# Patient Record
Sex: Female | Born: 1955 | Hispanic: No | Marital: Single | Smoking: Former smoker
Health system: Southern US, Community
[De-identification: ages and names within clinical notes are randomized; demographics above are authoritative.]

## PROBLEM LIST (undated history)

## (undated) DIAGNOSIS — I619 Nontraumatic intracerebral hemorrhage, unspecified: Secondary | ICD-10-CM

## (undated) DIAGNOSIS — I69391 Dysphagia following cerebral infarction: Secondary | ICD-10-CM

## (undated) DIAGNOSIS — J9610 Chronic respiratory failure, unspecified whether with hypoxia or hypercapnia: Secondary | ICD-10-CM

## (undated) DIAGNOSIS — I10 Essential (primary) hypertension: Secondary | ICD-10-CM

## (undated) DIAGNOSIS — R403 Persistent vegetative state: Secondary | ICD-10-CM

## (undated) DIAGNOSIS — K5909 Other constipation: Secondary | ICD-10-CM

## (undated) DIAGNOSIS — Z930 Tracheostomy status: Secondary | ICD-10-CM

## (undated) DIAGNOSIS — H16319 Corneal abscess, unspecified eye: Secondary | ICD-10-CM

## (undated) DIAGNOSIS — D649 Anemia, unspecified: Principal | ICD-10-CM

## (undated) DIAGNOSIS — Z720 Tobacco use: Secondary | ICD-10-CM

## (undated) DIAGNOSIS — E119 Type 2 diabetes mellitus without complications: Secondary | ICD-10-CM

## (undated) DIAGNOSIS — K219 Gastro-esophageal reflux disease without esophagitis: Secondary | ICD-10-CM

## (undated) DIAGNOSIS — N182 Chronic kidney disease, stage 2 (mild): Secondary | ICD-10-CM

## (undated) HISTORY — PX: TRACHEOSTOMY: SUR1362

## (undated) HISTORY — PX: PEG TUBE PLACEMENT: SUR1034

---

## 2013-12-19 ENCOUNTER — Inpatient Hospital Stay (HOSPITAL_COMMUNITY): Payer: Medicare Other

## 2013-12-19 ENCOUNTER — Encounter (HOSPITAL_COMMUNITY): Payer: Self-pay | Admitting: Acute Care

## 2013-12-19 ENCOUNTER — Inpatient Hospital Stay (HOSPITAL_COMMUNITY)
Admission: EM | Admit: 2013-12-19 | Discharge: 2013-12-21 | DRG: 811 | Disposition: A | Discharge status: Skilled Nursing Facility | Payer: Medicare Other | Source: Other Acute Inpatient Hospital | Attending: Pulmonary Disease | Admitting: Pulmonary Disease

## 2013-12-19 DIAGNOSIS — E119 Type 2 diabetes mellitus without complications: Secondary | ICD-10-CM | POA: Diagnosis present

## 2013-12-19 DIAGNOSIS — I69991 Dysphagia following unspecified cerebrovascular disease: Secondary | ICD-10-CM | POA: Diagnosis not present

## 2013-12-19 DIAGNOSIS — N182 Chronic kidney disease, stage 2 (mild): Secondary | ICD-10-CM | POA: Diagnosis present

## 2013-12-19 DIAGNOSIS — L89109 Pressure ulcer of unspecified part of back, unspecified stage: Secondary | ICD-10-CM | POA: Diagnosis present

## 2013-12-19 DIAGNOSIS — Z931 Gastrostomy status: Secondary | ICD-10-CM

## 2013-12-19 DIAGNOSIS — R403 Persistent vegetative state: Secondary | ICD-10-CM | POA: Diagnosis present

## 2013-12-19 DIAGNOSIS — I129 Hypertensive chronic kidney disease with stage 1 through stage 4 chronic kidney disease, or unspecified chronic kidney disease: Secondary | ICD-10-CM | POA: Diagnosis present

## 2013-12-19 DIAGNOSIS — Z930 Tracheostomy status: Secondary | ICD-10-CM

## 2013-12-19 DIAGNOSIS — E1169 Type 2 diabetes mellitus with other specified complication: Secondary | ICD-10-CM | POA: Diagnosis not present

## 2013-12-19 DIAGNOSIS — R569 Unspecified convulsions: Secondary | ICD-10-CM | POA: Diagnosis present

## 2013-12-19 DIAGNOSIS — R0902 Hypoxemia: Secondary | ICD-10-CM | POA: Diagnosis present

## 2013-12-19 DIAGNOSIS — D649 Anemia, unspecified: Principal | ICD-10-CM | POA: Diagnosis present

## 2013-12-19 DIAGNOSIS — D689 Coagulation defect, unspecified: Secondary | ICD-10-CM | POA: Diagnosis present

## 2013-12-19 DIAGNOSIS — J189 Pneumonia, unspecified organism: Secondary | ICD-10-CM | POA: Diagnosis present

## 2013-12-19 DIAGNOSIS — N179 Acute kidney failure, unspecified: Secondary | ICD-10-CM | POA: Diagnosis present

## 2013-12-19 DIAGNOSIS — Z9911 Dependence on respirator [ventilator] status: Secondary | ICD-10-CM | POA: Diagnosis not present

## 2013-12-19 DIAGNOSIS — I10 Essential (primary) hypertension: Secondary | ICD-10-CM | POA: Diagnosis present

## 2013-12-19 DIAGNOSIS — J9610 Chronic respiratory failure, unspecified whether with hypoxia or hypercapnia: Secondary | ICD-10-CM | POA: Diagnosis present

## 2013-12-19 DIAGNOSIS — I69391 Dysphagia following cerebral infarction: Secondary | ICD-10-CM

## 2013-12-19 DIAGNOSIS — K5900 Constipation, unspecified: Secondary | ICD-10-CM | POA: Diagnosis present

## 2013-12-19 DIAGNOSIS — L8992 Pressure ulcer of unspecified site, stage 2: Secondary | ICD-10-CM | POA: Diagnosis present

## 2013-12-19 DIAGNOSIS — K219 Gastro-esophageal reflux disease without esophagitis: Secondary | ICD-10-CM | POA: Diagnosis present

## 2013-12-19 DIAGNOSIS — K117 Disturbances of salivary secretion: Secondary | ICD-10-CM

## 2013-12-19 DIAGNOSIS — R7989 Other specified abnormal findings of blood chemistry: Secondary | ICD-10-CM

## 2013-12-19 DIAGNOSIS — Z515 Encounter for palliative care: Secondary | ICD-10-CM

## 2013-12-19 DIAGNOSIS — Z79899 Other long term (current) drug therapy: Secondary | ICD-10-CM

## 2013-12-19 HISTORY — DX: Anemia, unspecified: D64.9

## 2013-12-19 HISTORY — DX: Gastro-esophageal reflux disease without esophagitis: K21.9

## 2013-12-19 HISTORY — DX: Persistent vegetative state: R40.3

## 2013-12-19 HISTORY — DX: Nontraumatic intracerebral hemorrhage, unspecified: I61.9

## 2013-12-19 HISTORY — DX: Other constipation: K59.09

## 2013-12-19 HISTORY — DX: Essential (primary) hypertension: I10

## 2013-12-19 HISTORY — DX: Chronic respiratory failure, unspecified whether with hypoxia or hypercapnia: J96.10

## 2013-12-19 HISTORY — DX: Type 2 diabetes mellitus without complications: E11.9

## 2013-12-19 HISTORY — DX: Dysphagia following cerebral infarction: I69.391

## 2013-12-19 HISTORY — DX: Tracheostomy status: Z93.0

## 2013-12-19 HISTORY — DX: Chronic kidney disease, stage 2 (mild): N18.2

## 2013-12-19 HISTORY — DX: Corneal abscess, unspecified eye: H16.319

## 2013-12-19 LAB — CBC WITH DIFFERENTIAL/PLATELET
Basophils Absolute: 0 10*3/uL (ref 0.0–0.1)
Basophils Relative: 0 % (ref 0–1)
EOS ABS: 0.2 10*3/uL (ref 0.0–0.7)
EOS PCT: 1 % (ref 0–5)
HCT: 19.3 % — ABNORMAL LOW (ref 36.0–46.0)
Hemoglobin: 6.3 g/dL — CL (ref 12.0–15.0)
LYMPHS ABS: 0.6 10*3/uL — AB (ref 0.7–4.0)
Lymphocytes Relative: 5 % — ABNORMAL LOW (ref 12–46)
MCH: 30 pg (ref 26.0–34.0)
MCHC: 32.6 g/dL (ref 30.0–36.0)
MCV: 91.9 fL (ref 78.0–100.0)
Monocytes Absolute: 0.5 10*3/uL (ref 0.1–1.0)
Monocytes Relative: 4 % (ref 3–12)
Neutro Abs: 11 10*3/uL — ABNORMAL HIGH (ref 1.7–7.7)
Neutrophils Relative %: 90 % — ABNORMAL HIGH (ref 43–77)
PLATELETS: 184 10*3/uL (ref 150–400)
RBC: 2.1 MIL/uL — AB (ref 3.87–5.11)
RDW: 18.6 % — ABNORMAL HIGH (ref 11.5–15.5)
WBC: 12.3 10*3/uL — ABNORMAL HIGH (ref 4.0–10.5)

## 2013-12-19 LAB — URINALYSIS, ROUTINE W REFLEX MICROSCOPIC
GLUCOSE, UA: NEGATIVE mg/dL
KETONES UR: NEGATIVE mg/dL
Nitrite: NEGATIVE
Protein, ur: 30 mg/dL — AB
Specific Gravity, Urine: 1.015 (ref 1.005–1.030)
Urobilinogen, UA: 1 mg/dL (ref 0.0–1.0)
pH: 8 (ref 5.0–8.0)

## 2013-12-19 LAB — GLUCOSE, CAPILLARY
Glucose-Capillary: 122 mg/dL — ABNORMAL HIGH (ref 70–99)
Glucose-Capillary: 79 mg/dL (ref 70–99)

## 2013-12-19 LAB — COMPREHENSIVE METABOLIC PANEL
ALBUMIN: 1.7 g/dL — AB (ref 3.5–5.2)
ALT: 68 U/L — ABNORMAL HIGH (ref 0–35)
AST: 57 U/L — AB (ref 0–37)
Alkaline Phosphatase: 334 U/L — ABNORMAL HIGH (ref 39–117)
Anion gap: 13 (ref 5–15)
BUN: 106 mg/dL — ABNORMAL HIGH (ref 6–23)
CALCIUM: 7.2 mg/dL — AB (ref 8.4–10.5)
CO2: 22 mEq/L (ref 19–32)
CREATININE: 1.26 mg/dL — AB (ref 0.50–1.10)
Chloride: 105 mEq/L (ref 96–112)
GFR calc Af Amer: 53 mL/min — ABNORMAL LOW (ref >90)
GFR, EST NON AFRICAN AMERICAN: 46 mL/min — AB (ref >90)
Glucose, Bld: 91 mg/dL (ref 70–99)
Potassium: 3 mEq/L — ABNORMAL LOW (ref 3.7–5.3)
SODIUM: 140 meq/L (ref 137–147)
TOTAL PROTEIN: 6.3 g/dL (ref 6.0–8.3)
Total Bilirubin: 1 mg/dL (ref 0.3–1.2)

## 2013-12-19 LAB — CBC
HEMATOCRIT: 23.5 % — AB (ref 36.0–46.0)
HEMOGLOBIN: 7.6 g/dL — AB (ref 12.0–15.0)
MCH: 29 pg (ref 26.0–34.0)
MCHC: 32.3 g/dL (ref 30.0–36.0)
MCV: 89.7 fL (ref 78.0–100.0)
Platelets: 210 10*3/uL (ref 150–400)
RBC: 2.62 MIL/uL — ABNORMAL LOW (ref 3.87–5.11)
RDW: 18.2 % — AB (ref 11.5–15.5)
WBC: 12.4 10*3/uL — AB (ref 4.0–10.5)

## 2013-12-19 LAB — POCT I-STAT 3, ART BLOOD GAS (G3+)
Acid-Base Excess: 5 mmol/L — ABNORMAL HIGH (ref 0.0–2.0)
BICARBONATE: 30.1 meq/L — AB (ref 20.0–24.0)
O2 Saturation: 87 %
PH ART: 7.412 (ref 7.350–7.450)
PO2 ART: 54 mmHg — AB (ref 80.0–100.0)
TCO2: 32 mmol/L (ref 0–100)
pCO2 arterial: 47.2 mmHg — ABNORMAL HIGH (ref 35.0–45.0)

## 2013-12-19 LAB — URINE MICROSCOPIC-ADD ON

## 2013-12-19 LAB — RETICULOCYTES
RBC.: 2.1 MIL/uL — ABNORMAL LOW (ref 3.87–5.11)
Retic Count, Absolute: 67.2 10*3/uL (ref 19.0–186.0)
Retic Ct Pct: 3.2 % — ABNORMAL HIGH (ref 0.4–3.1)

## 2013-12-19 LAB — OCCULT BLOOD GASTRIC / DUODENUM (SPECIMEN CUP): OCCULT BLOOD, GASTRIC: NEGATIVE

## 2013-12-19 LAB — IRON AND TIBC
Iron: 20 ug/dL — ABNORMAL LOW (ref 42–135)
SATURATION RATIOS: 10 % — AB (ref 20–55)
TIBC: 191 ug/dL — AB (ref 250–470)
UIBC: 171 ug/dL (ref 125–400)

## 2013-12-19 LAB — ABO/RH: ABO/RH(D): O POS

## 2013-12-19 LAB — LACTIC ACID, PLASMA: Lactic Acid, Venous: 0.9 mmol/L (ref 0.5–2.2)

## 2013-12-19 LAB — AMYLASE: AMYLASE: 79 U/L (ref 0–105)

## 2013-12-19 LAB — PRO B NATRIURETIC PEPTIDE: Pro B Natriuretic peptide (BNP): 17649 pg/mL — ABNORMAL HIGH (ref 0–125)

## 2013-12-19 LAB — FERRITIN: Ferritin: 478 ng/mL — ABNORMAL HIGH (ref 10–291)

## 2013-12-19 LAB — PROCALCITONIN: PROCALCITONIN: 1.06 ng/mL

## 2013-12-19 LAB — MRSA PCR SCREENING: MRSA by PCR: NEGATIVE

## 2013-12-19 LAB — PROTIME-INR
INR: 1.63 — ABNORMAL HIGH (ref 0.00–1.49)
PROTHROMBIN TIME: 19.3 s — AB (ref 11.6–15.2)

## 2013-12-19 LAB — MAGNESIUM: MAGNESIUM: 2.3 mg/dL (ref 1.5–2.5)

## 2013-12-19 LAB — PHOSPHORUS: PHOSPHORUS: 3.6 mg/dL (ref 2.3–4.6)

## 2013-12-19 LAB — VITAMIN B12: VITAMIN B 12: 1328 pg/mL — AB (ref 211–911)

## 2013-12-19 LAB — PREPARE RBC (CROSSMATCH)

## 2013-12-19 LAB — APTT: aPTT: 51 seconds — ABNORMAL HIGH (ref 24–37)

## 2013-12-19 LAB — FOLATE: Folate: >20 ng/mL

## 2013-12-19 MED ORDER — PANTOPRAZOLE SODIUM 40 MG IV SOLR
40.0000 mg | Freq: Every day | INTRAVENOUS | Status: DC
  Filled 2013-12-19: qty 40

## 2013-12-19 MED ORDER — BIOTENE DRY MOUTH MT LIQD
15.0000 mL | Freq: Four times a day (QID) | OROMUCOSAL | Status: DC
  Administered 2013-12-20 – 2013-12-21 (×8): 15 mL via OROMUCOSAL

## 2013-12-19 MED ORDER — INSULIN GLARGINE 100 UNIT/ML ~~LOC~~ SOLN
10.0000 [IU] | Freq: Every day | SUBCUTANEOUS | Status: DC
  Administered 2013-12-19: 10 [IU] via SUBCUTANEOUS
  Filled 2013-12-19 (×2): qty 0.1

## 2013-12-19 MED ORDER — MINOXIDIL 2.5 MG PO TABS
5.0000 mg | ORAL_TABLET | Freq: Every day | ORAL | Status: DC
  Administered 2013-12-21: 5 mg
  Filled 2013-12-19 (×3): qty 2

## 2013-12-19 MED ORDER — LEVETIRACETAM 100 MG/ML PO SOLN
500.0000 mg | Freq: Two times a day (BID) | ORAL | Status: DC
  Administered 2013-12-19 – 2013-12-21 (×4): 500 mg
  Filled 2013-12-19 (×6): qty 5

## 2013-12-19 MED ORDER — PANTOPRAZOLE SODIUM 40 MG IV SOLR
40.0000 mg | Freq: Two times a day (BID) | INTRAVENOUS | Status: DC
  Administered 2013-12-19 – 2013-12-21 (×4): 40 mg via INTRAVENOUS
  Filled 2013-12-19 (×5): qty 40

## 2013-12-19 MED ORDER — CHLORHEXIDINE GLUCONATE 0.12 % MT SOLN
15.0000 mL | Freq: Two times a day (BID) | OROMUCOSAL | Status: DC
  Administered 2013-12-19 – 2013-12-21 (×4): 15 mL via OROMUCOSAL
  Filled 2013-12-19 (×6): qty 15

## 2013-12-19 MED ORDER — VANCOMYCIN HCL 10 G IV SOLR
1250.0000 mg | INTRAVENOUS | Status: DC
  Administered 2013-12-20: 1250 mg via INTRAVENOUS
  Filled 2013-12-19: qty 1250

## 2013-12-19 MED ORDER — SODIUM CHLORIDE 0.9 % IV BOLUS (SEPSIS)
1000.0000 mL | Freq: Once | INTRAVENOUS | Status: AC
  Administered 2013-12-19: 1000 mL via INTRAVENOUS

## 2013-12-19 MED ORDER — SODIUM CHLORIDE 0.9 % IV SOLN: 250.0000 mL | INTRAVENOUS | Status: DC | PRN

## 2013-12-19 MED ORDER — SODIUM CHLORIDE 0.9 % IV SOLN
INTRAVENOUS | Status: DC
Start: 2013-12-19 — End: 2013-12-20
  Administered 2013-12-19: 22:00:00 via INTRAVENOUS

## 2013-12-19 MED ORDER — HYDRALAZINE HCL 50 MG PO TABS
50.0000 mg | ORAL_TABLET | Freq: Three times a day (TID) | ORAL | Status: DC
  Administered 2013-12-20 – 2013-12-21 (×2): 50 mg
  Filled 2013-12-19 (×9): qty 1

## 2013-12-19 MED ORDER — INSULIN ASPART 100 UNIT/ML ~~LOC~~ SOLN
0.0000 [IU] | SUBCUTANEOUS | Status: DC
  Administered 2013-12-19: 2 [IU] via SUBCUTANEOUS

## 2013-12-19 MED ORDER — GUAIFENESIN 100 MG/5ML PO SOLN
10.0000 mL | ORAL | Status: DC
  Filled 2013-12-19 (×4): qty 10

## 2013-12-19 MED ORDER — CEFEPIME HCL 1 G IJ SOLR
1.0000 g | INTRAMUSCULAR | Status: DC
  Administered 2013-12-20 – 2013-12-21 (×2): 1 g via INTRAVENOUS
  Filled 2013-12-19 (×2): qty 1

## 2013-12-19 NOTE — H&P (Addendum)
PULMONARY / CRITICAL CARE MEDICINE  Name: [name] MRN: [id number] DOB: 1955-11-03    ADMISSION DATE: 12/19/2013 CONSULTATION DATE: 12/19/2013  REFERRING MD :  [facility]   CHIEF COMPLAINT:  Anemia / renal failure  INITIAL PRESENTATION: 58 yo female with recent ICH complicated by respiratory failure requiring tracheostomy transfered from [facility] with anemia requiring transfusion, possible HCAP and progressive azotemia.  STUDIES / EVENTS:  LINES: Trach PEG R UE PICC Foley  CULTURES: 7/21  Respiratory >>> 7/21  Urine >>> 7/21  Blood >>>  ANTIBIOTICS: Cefepime 7/20 >>> Vancomycin 7/20 >>>  HISTORY OF PRESENT ILLNESS:  58 y.o. Female with pmh of vent dependent chronic respiratory failure, intracerebral hemorrhage, persistent vegetative state, DM, Htn and GERD who presented from [facility] with anemia despite blood transfusion. Per [facility]'s notes, the patient developed a hgb 6.8 on 7/17 and was given 1 unit of PRBCs and her hgb 7/20 was 7.3. She also then developed azotemia secondary to the blood transfusion. On 7/20 she was treated for HCAP and started on vanc/cefepime. [facility]   PAST MEDICAL HISTORY :  Past Medical History  Diagnosis Date  . Chronic respiratory failure   . Tracheostomy status   . Persist vegetative state   . 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 Type II diabetes mellitus   . HTN (hypertension)   . Anemia, unspecified   . CKD (chronic kidney disease) stage 2, GFR 60-89 ml/min   . Corneal abscess     history of   . Gastro-esophageal reflux   . Chronic constipation   . 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 Peg tube placement      unknown insertion date   . Tracheostomy      unknown insertion site   Prior to Admission medications   Not on File   Allergies  Allergen Reactions  . Calcium Channel Blockers   .  Haldol [Haloperidol Lactate]   . Hctz [Hydrochlorothiazide]   . Lopressor [Metoprolol Tartrate]    FAMILY HISTORY:  No family history on file.  SOCIAL HISTORY:  has no tobacco, alcohol, and drug history on file.  REVIEW OF SYSTEMS: Unable to obtain  INTERVAL HISTORY:   VITAL SIGNS: Temp:  [97.6 F (36.4 C)] 97.6 F (36.4 C) (07/21 1641) Pulse Rate:  [83-86] 83 (07/21 1700) Resp:  [0-16] 0 (07/21 1700) BP: (106)/(51) 106/51 mmHg (07/21 1600) SpO2:  [91 %-100 %] 100 % (07/21 1700) FiO2 (%):  [60 %-100 %] 100 % (07/21 1643) Weight:  [84.8 kg (186 lb 15.2 oz)] 84.8 kg (186 lb 15.2 oz) (07/21 1549)  HEMODYNAMICS:   VENTILATOR SETTINGS: Vent Mode:  [-] PRVC FiO2 (%):  [60 %-100 %] 100 % Set Rate:  [16 bmp-18 bmp] 18 bmp Vt Set:  [450 mL] 450 mL PEEP:  [10 cmH20-12 cmH20] 12 cmH20 Plateau Pressure:  [33 cmH20] 33 cmH20 INTAKE / OUTPUT: Intake/Output   None     PHYSICAL EXAMINATION: General: Chronically ill appearing female Neuro: Opens eyes to pain, MAEs HEENT: Trach in place, small amount blood tinged sputum Cardiovascular: Regular, no murmurs Lungs: Scattered rhonchi Abdomen: Firm, non-tender, Peg in place Musculoskeletal: 1+ pitting edema b/l hands and LEs, poor muscle tone Skin: Stage II on sacrum  LABS:  CBC No results found for this basename: WBC, HGB, HCT, PLT,  in the last 168 hours  Coag's No results found for this basename: APTT, INR,  in the last 168 hours BMET No results found for this basename: NA, K, CL, CO2, BUN, CREATININE, GLUCOSE,  in the last 168 hours Electrolytes No results found for this basename: CALCIUM, MG, PHOS,  in the last 168 hours Sepsis Markers No results found for this basename: LATICACIDVEN, PROCALCITON, O2SATVEN,  in the last 168 hours ABG  Recent Labs Lab 12/19/13 1638  PHART 7.412  PCO2ART 47.2*  PO2ART 54.0*   Liver Enzymes No results found for this basename: AST, ALT, ALKPHOS, BILITOT, ALBUMIN,  in the last 168  hours Cardiac Enzymes No results found for this basename: TROPONINI, PROBNP,  in the last 168 hours Glucose  Recent Labs Lab 12/19/13 1621  GLUCAP 122*   IMAGING:   Dg Chest Port 1 View  12/19/2013   CLINICAL DATA:  Congestion, evaluate for pneumonia  EXAM: PORTABLE CHEST - 1 VIEW  COMPARISON:  None.  FINDINGS: Tracheostomy tube projects over the tracheal air column. Right-sided decline projects with tip over the cavoatrial junction.  Mild cardiac enlargement. Severe bilateral diffuse airspace disease with mixed interstitial and alveolar opacities. No significant pleural effusion.  IMPRESSION: Extensive bilateral airspace disease. Acute pulmonary edema is a consideration. Other possibilities include extensive bilateral pneumonia, alveolar hemorrhage, ARDS.   Electronically Signed   By: Esperanza Heir M.D.   On: 12/19/2013 15:50   ASSESSMENT / PLAN:  PULMONARY A: Chronic respiratory failure Tracheostomy status  Possible HCAP P:   Goal pH>7.30, SpO2>92 Continuous mechanical support VAP bundle Daily SBT Trend ABG/CXR See ID   CARDIOVASCULAR A:  HTN P:  Hydralazine Minoxidil CVP  RENAL A:   Reportedly azotemia CKD  P:   Check CVP NS@125  NS 1000 x 1  GASTROINTESTINAL A:   GERD Constipation PEG status GI Px P:   TF per Nutritionist Miralax Nutritional c/s 7/22 Protonix  HEMATOLOGIC A:  Acute on chronic anemia P:  Trend CBC APTT / INR Guaiac stool EGD?  INFECTIOUS A:   Suspected CAP P:   Cx/ abx as above PCT  ENDOCRINE A:   Diabetes P:   SSI  Lantus  NEUROLOGIC A:   S/p intracranial hemorrhage Persistent vegetative state Seizures P:   Keppra  I have personally obtained history, examined patient, evaluated and interpreted laboratory and imaging results, reviewed medical records, formulated assessment / plan and placed orders.  CRITICAL CARE:  The patient is critically ill with multiple organ systems failure and requires high  complexity decision making for assessment and support, frequent evaluation and titration of therapies, application of advanced monitoring technologies and extensive interpretation of multiple databases. Critical Care Time devoted to patient care services described in this note is 45 minutes.   Lonia Farber, MD Pulmonary and Critical Care Medicine [facility] Pager: [phone]  12/19/2013, 5:31 PM

## 2013-12-19 NOTE — Progress Notes (Signed)
eLink Physician-Brief Progress Note Patient Name: [name] DOB: [birth year] MRN: [id number]  Date of Service  12/19/2013   HPI/Events of Note   CXR, gas reviewed Severe hypoxemia Diffuse bilateral infiltrates  eICU Interventions  Increased PEEP Wean FiO2 to 60% with O2 sat goal > 90%      [name] 12/19/2013, 4:41 PM

## 2013-12-19 NOTE — Progress Notes (Signed)
eLink Physician-Brief Progress Note Patient Name: [name] DOB: May 26, 1956 MRN: [id number]  Date of Service  12/19/2013   HPI/Events of Note   Hgb 6.3 No sign of active bleeding  eICU Interventions  Transfuse 1 U PRBC, repeat h/h   Intervention Category Intermediate Interventions: Bleeding - evaluation and treatment with blood products  [name] 12/19/2013, 6:05 PM

## 2013-12-19 NOTE — Progress Notes (Signed)
ANTIBIOTIC CONSULT NOTE - INITIAL  Pharmacy Consult for vancomycin and cefepime Indication: pneumonia  Allergies not on file  Patient Measurements:   Adjusted Body Weight:   Vital Signs:   Intake/Output from previous day:   Intake/Output from this shift:    Labs: No results found for this basename: WBC, HGB, PLT, LABCREA, CREATININE,  in the last 72 hours CrCl is unknown because no creatinine reading has been taken and the patient has no height on file. No results found for this basename: VANCOTROUGH, VANCOPEAK, VANCORANDOM, GENTTROUGH, GENTPEAK, GENTRANDOM, TOBRATROUGH, TOBRAPEAK, TOBRARND, AMIKACINPEAK, AMIKACINTROU, AMIKACIN,  in the last 72 hours   Microbiology: No results found for this or any previous visit (from the past 720 hour(s)).  Medical History: No past medical history on file.  Medications:  Scheduled:  . guaiFENesin  10 mL Per Tube 6 times per day  . hydrALAZINE  50 mg Per Tube 3 times per day  . insulin aspart  0-15 Units Subcutaneous 6 times per day  . insulin glargine  10 Units Subcutaneous Daily  . levETIRAcetam  500 mg Per Tube BID  . minoxidil  5 mg Per Tube Daily  . pantoprazole (PROTONIX) IV  40 mg Intravenous QHS   Infusions:  . sodium chloride     Assessment: 58 yo female with potential HCAP will be continued on vancomycin and cefepime.  Patient currently on has no lab, but SCr from [facility] was 1.7 today (Crcl ~32). Patient was transferred from [facility] and was on vancomycin 1g iv q12h (started 12/18/13) and cefepime 1g iv q12h there. Last dose was 0900 on 12/19/13  Goal of Therapy:  Vancomycin trough level 15-20 mcg/ml  Plan:  1) change cefepime to 1g iv q24h, next dose at 0900 tom 2) change vancomycin to 1250mg  iv q24h, next dose at 0700 tom 3) monitor renal function closely  Shalaine Payson, Tsz-Yin 12/19/2013,3:35 PM

## 2013-12-20 ENCOUNTER — Inpatient Hospital Stay (HOSPITAL_COMMUNITY): Payer: Medicare Other

## 2013-12-20 ENCOUNTER — Encounter (HOSPITAL_COMMUNITY): Payer: Self-pay

## 2013-12-20 DIAGNOSIS — Z515 Encounter for palliative care: Secondary | ICD-10-CM

## 2013-12-20 DIAGNOSIS — N182 Chronic kidney disease, stage 2 (mild): Secondary | ICD-10-CM

## 2013-12-20 DIAGNOSIS — D649 Anemia, unspecified: Secondary | ICD-10-CM | POA: Diagnosis not present

## 2013-12-20 DIAGNOSIS — K1370 Unspecified lesions of oral mucosa: Secondary | ICD-10-CM

## 2013-12-20 LAB — BLOOD GAS, ARTERIAL
Acid-Base Excess: 2.4 mmol/L — ABNORMAL HIGH (ref 0.0–2.0)
Bicarbonate: 27.3 mEq/L — ABNORMAL HIGH (ref 20.0–24.0)
DRAWN BY: 39899
FIO2: 0.8 %
LHR: 18 {breaths}/min
O2 SAT: 92.5 %
PATIENT TEMPERATURE: 98.6
PEEP: 12 cmH2O
TCO2: 28.8 mmol/L (ref 0–100)
VT: 450 mL
pCO2 arterial: 48.5 mmHg — ABNORMAL HIGH (ref 35.0–45.0)
pH, Arterial: 7.368 (ref 7.350–7.450)
pO2, Arterial: 58.9 mmHg — ABNORMAL LOW (ref 80.0–100.0)

## 2013-12-20 LAB — BASIC METABOLIC PANEL
Anion gap: 15 (ref 5–15)
BUN: 121 mg/dL — AB (ref 6–23)
CHLORIDE: 98 meq/L (ref 96–112)
CO2: 25 meq/L (ref 19–32)
Calcium: 8.7 mg/dL (ref 8.4–10.5)
Creatinine, Ser: 1.51 mg/dL — ABNORMAL HIGH (ref 0.50–1.10)
GFR calc Af Amer: 43 mL/min — ABNORMAL LOW (ref >90)
GFR calc non Af Amer: 37 mL/min — ABNORMAL LOW (ref >90)
GLUCOSE: 115 mg/dL — AB (ref 70–99)
POTASSIUM: 3.6 meq/L — AB (ref 3.7–5.3)
Sodium: 138 mEq/L (ref 137–147)

## 2013-12-20 LAB — TYPE AND SCREEN
ABO/RH(D): O POS
ANTIBODY SCREEN: NEGATIVE
Unit division: 0

## 2013-12-20 LAB — CBC
HEMATOCRIT: 23.3 % — AB (ref 36.0–46.0)
Hemoglobin: 7.5 g/dL — ABNORMAL LOW (ref 12.0–15.0)
MCH: 29.3 pg (ref 26.0–34.0)
MCHC: 32.2 g/dL (ref 30.0–36.0)
MCV: 91 fL (ref 78.0–100.0)
Platelets: 225 10*3/uL (ref 150–400)
RBC: 2.56 MIL/uL — ABNORMAL LOW (ref 3.87–5.11)
RDW: 18.1 % — ABNORMAL HIGH (ref 11.5–15.5)
WBC: 11.6 10*3/uL — AB (ref 4.0–10.5)

## 2013-12-20 LAB — PROCALCITONIN: Procalcitonin: 1.16 ng/mL

## 2013-12-20 LAB — CORTISOL: Cortisol, Plasma: 14.1 ug/dL

## 2013-12-20 LAB — GLUCOSE, CAPILLARY
GLUCOSE-CAPILLARY: 117 mg/dL — AB (ref 70–99)
GLUCOSE-CAPILLARY: 48 mg/dL — AB (ref 70–99)
Glucose-Capillary: 61 mg/dL — ABNORMAL LOW (ref 70–99)
Glucose-Capillary: 85 mg/dL (ref 70–99)
Glucose-Capillary: 72 mg/dL (ref 70–99)
Glucose-Capillary: 118 mg/dL — ABNORMAL HIGH (ref 70–99)
Glucose-Capillary: 120 mg/dL — ABNORMAL HIGH (ref 70–99)

## 2013-12-20 LAB — URINE CULTURE: Colony Count: >=100000

## 2013-12-20 LAB — OCCULT BLOOD X 1 CARD TO LAB, STOOL: FECAL OCCULT BLD: NEGATIVE

## 2013-12-20 MED ORDER — PRO-STAT SUGAR FREE PO LIQD
30.0000 mL | Freq: Two times a day (BID) | ORAL | Status: AC
  Administered 2013-12-20 (×2): 30 mL
  Filled 2013-12-20 (×2): qty 30

## 2013-12-20 MED ORDER — VITAL HIGH PROTEIN PO LIQD
1000.0000 mL | ORAL | Status: DC
  Administered 2013-12-20: 1000 mL
  Filled 2013-12-20 (×4): qty 1000

## 2013-12-20 MED ORDER — DEXTROSE 50 % IV SOLN
INTRAVENOUS | Status: AC
  Administered 2013-12-20: 50 mL
  Administered 2013-12-20: 25 mL
  Filled 2013-12-20: qty 50

## 2013-12-20 MED ORDER — VITAL HIGH PROTEIN PO LIQD
1000.0000 mL | ORAL | Status: DC
  Administered 2013-12-20: 1000 mL
  Filled 2013-12-20 (×2): qty 1000

## 2013-12-20 MED ORDER — DEXTROSE-NACL 5-0.45 % IV SOLN
INTRAVENOUS | Status: DC
  Administered 2013-12-20: 05:00:00 via INTRAVENOUS

## 2013-12-20 MED ORDER — VITAMIN K1 10 MG/ML IJ SOLN
10.0000 mg | Freq: Once | INTRAVENOUS | Status: AC
  Administered 2013-12-20: 10 mg via INTRAVENOUS
  Filled 2013-12-20: qty 1

## 2013-12-20 NOTE — Progress Notes (Addendum)
PULMONARY / CRITICAL CARE MEDICINE  Name: [name] MRN: [id number] DOB: [birth year]    ADMISSION DATE: 12/19/2013 CONSULTATION DATE: 12/19/2013  REFERRING MD: [facility]   CHIEF COMPLAINT: Anemia / renal failure  INITIAL PRESENTATION: 58 yo female with recent ICH complicated by respiratory failure requiring tracheostomy transfered from [facility] with anemia requiring transfusion, possible HCAP and progressive azotemia.  STUDIES / EVENTS:  LINES: Trach PEG R UE PICC Foley  CULTURES: 7/21  Respiratory >>> 7/21  Urine >>> 7/21  Blood >>>  ANTIBIOTICS: Cefepime 7/20 >>> Vancomycin 7/20 >>>  SUBJECTIVE: Stable vegetative state, FOBT negative, hemoglobin stable, likely infected but source unclear at this point  VITAL SIGNS: Temp:  [97.5 F (36.4 C)-98.7 F (37.1 C)] 97.7 F (36.5 C) (07/22 0441) Pulse Rate:  [78-90] 85 (07/22 0600) Resp:  [0-28] 19 (07/22 0600) BP: (89-118)/(42-66) 103/50 mmHg (07/22 0600) SpO2:  [91 %-100 %] 98 % (07/22 0600) FiO2 (%):  [60 %-100 %] 80 % (07/22 0347) Weight:  [186 lb 15.2 oz (84.8 kg)-187 lb 6.3 oz (85 kg)] 187 lb 6.3 oz (85 kg) (07/22 0500)  HEMODYNAMICS: CVP:  [16 mmHg-20 mmHg] 19 mmHg VENTILATOR SETTINGS: Vent Mode:  [-] PRVC FiO2 (%):  [60 %-100 %] 80 % Set Rate:  [16 bmp-18 bmp] 18 bmp Vt Set:  [450 mL] 450 mL PEEP:  [10 cmH20-12 cmH20] 12 cmH20 Plateau Pressure:  [27 cmH20-33 cmH20] 27 cmH20 INTAKE / OUTPUT: Intake/Output     07/21 0701 - 07/22 0700 07/22 0701 - 07/23 0700   I.V. (mL/kg) 1125 (13.2)    Blood 327    IV Piggyback 1000    Total Intake(mL/kg) 2452 (28.8)    Urine (mL/kg/hr) 475    Total Output 475     Net +1977            PHYSICAL EXAMINATION: General: Chronically ill appearing female Neuro: Unresponsive, dilated nonreactive pupils, no purposeful movement noted HEENT: Trach in place Cardiovascular: Regular, no murmurs Lungs: Scattered rhonchi Abdomen: Firm, non-tender, Peg in place Musculoskeletal: 1+  pitting edema b/l hands and LEs, poor muscle tone Skin: Stage II on sacrum  LABS:  CBC  Recent Labs Lab 12/19/13 1710 12/19/13 2310 12/20/13 0500  WBC 12.3* 12.4* 11.6*  HGB 6.3* 7.6* 7.5*  HCT 19.3* 23.5* 23.3*  PLT 184 210 225   Coag's  Recent Labs Lab 12/19/13 1710  APTT 51*  INR 1.63*   BMET  Recent Labs Lab 12/19/13 1710 12/20/13 0500  NA 140 138  K 3.0* 3.6*  CL 105 98  CO2 22 25  BUN 106* 121*  CREATININE 1.26* 1.51*  GLUCOSE 91 115*   Electrolytes  Recent Labs Lab 12/19/13 1710 12/20/13 0500  CALCIUM 7.2* 8.7  MG 2.3  --   PHOS 3.6  --    Sepsis Markers  Recent Labs Lab 12/19/13 1532 12/19/13 1710 12/20/13 0500  LATICACIDVEN  --  0.9  --   PROCALCITON 1.06  --  1.16   ABG  Recent Labs Lab 12/19/13 1638 12/20/13 0355  PHART 7.412 7.368  PCO2ART 47.2* 48.5*  PO2ART 54.0* 58.9*   Liver Enzymes  Recent Labs Lab 12/19/13 1710  AST 57*  ALT 68*  ALKPHOS 334*  BILITOT 1.0  ALBUMIN 1.7*   Cardiac Enzymes  Recent Labs Lab 12/19/13 1710  PROBNP 17649.0*   Glucose  Recent Labs Lab 12/19/13 1621 12/19/13 1931 12/20/13 0001 12/20/13 0022 12/20/13 0413  GLUCAP 122* 79 48* 117* 61*   IMAGING:   Dg  Chest Port 1 View  12/19/2013   CLINICAL DATA:  Congestion, evaluate for pneumonia  EXAM: PORTABLE CHEST - 1 VIEW  COMPARISON:  None.  FINDINGS: Tracheostomy tube projects over the tracheal air column. Right-sided decline projects with tip over the cavoatrial junction.  Mild cardiac enlargement. Severe bilateral diffuse airspace disease with mixed interstitial and alveolar opacities. No significant pleural effusion.  IMPRESSION: Extensive bilateral airspace disease. Acute pulmonary edema is a consideration. Other possibilities include extensive bilateral pneumonia, alveolar hemorrhage, ARDS.   Electronically Signed   By: Esperanza Heir M.D.   On: 12/19/2013 15:50   ASSESSMENT / PLAN:  PULMONARY A: Chronic respiratory  failure Tracheostomy status  Possible HCAP vs edema P:   abg reviewed, keep same MV Vent dependent and acute illness- holding weaning Avoid gross balance  CARDIOVASCULAR A:  HTN P:  Hydralazine Minoxidil CVP dc Tele ok for now  RENAL A:   Reportedly azotemia CKD BUN elevation contribution from GI bleed/ P:   Dc CVP d5 1/2NS@75 -> kvo HD / cvvhd would be futile and medically ineffective  GASTROINTESTINAL A:   GERD Constipation PEG status GI Px No evidence thus far bleeding P:   TF per Nutritionist, add consult and start protocol Miralax Nutritional c/s 7/22 Protonix  HEMATOLOGIC A:  Acute on chronic anemia, FOBT neg Coagulopathy, INR 1.63, PTT 51 unclear, lovenox in setting arf? P:  Trend CBC fobt neg, follow cbc in am Vit K 10 mg IV, repeat coags in am  Dc any anticoagulation further scd If after correction coags, hct still drops, consider GI consult  INFECTIOUS A:  Fevers reported from [facility] R/o HCAP, PCT 1.06->1.16 P:   Cx/ abx as above Trend PCT to limit abx and dc at 72 hrs Follow micro  ENDOCRINE A:   Diabetes P:   SSI dc D5 needed Hold lantus for hypoglycemia  NEUROLOGIC A:   S/p intracranial hemorrhage Persistent vegetative state Seizures P:   Keppra  Any forms of life support are medically ineffective and futile. Will consult palliative care to consider futility policy if family requests aggressive care. To sdu vent bed  Beverely Low, MD, MPH [facility] PGY-2 12/20/2013 7:32 AM  Ccm time 30 min   I have fully examined this patient and agree with above findings.     Mcarthur Rossetti. Tyson Alias, MD, FACP Pgr: [phone]  Pulmonary & Critical Care

## 2013-12-20 NOTE — Progress Notes (Signed)
INITIAL NUTRITION ASSESSMENT  DOCUMENTATION CODES Per approved criteria  -Obesity Unspecified   INTERVENTION:  Utilize 10M PEPuP Protocol: initiate TF via PEG with Vital High Protein at 25 ml/h and Prostat 30 ml BID on day 1; on day 2, increase to goal rate of 60 ml/h (1440 ml per day) without Prostat to provide 1440 kcals (25 kcals/kg ideal weight), 126 gm protein, 1204 ml free water daily.  NUTRITION DIAGNOSIS: Inadequate oral intake related to inability to eat as evidenced by NPO status.   Goal: Enteral nutrition to provide 60-70% of estimated calorie needs (22-25 kcals/kg ideal body weight) and 100% of estimated protein needs, based on ASPEN guidelines for permissive underfeeding in critically ill obese individuals  Monitor:  TF tolerance/adequacy, weight trend, labs, vent status.  Reason for Assessment: MD Consult for TF initiation and management.  58 y.o. female  Admitting Dx: Anemia, Renal failure  ASSESSMENT: 57 yo female with recent ICH complicated by respiratory failure requiring tracheostomy transfered from [facility] with anemia requiring transfusion, possible HCAP and progressive azotemia.   Patient has a PEG, unsure of usual tube feed regimen.  Patient is currently intubated on ventilator support MV: 8.7 L/min Temp (24hrs), Avg:98.1 F (36.7 C), Min:97.5 F (36.4 C), Max:98.7 F (37.1 C)  Propofol: none  Height: Ht Readings from Last 1 Encounters:  12/19/13 5\' 5"  (1.651 m)    Weight: Wt Readings from Last 1 Encounters:  12/20/13 187 lb 6.3 oz (85 kg)    Ideal Body Weight: 56.8 kg  % Ideal Body Weight: 150%  Wt Readings from Last 10 Encounters:  12/20/13 187 lb 6.3 oz (85 kg)    Usual Body Weight: unknown  % Usual Body Weight: N/A  BMI:  Body mass index is 31.18 kg/(m^2). class 1 obesity  Estimated Nutritional Needs: Kcal: 1631 Protein: 114 gm Fluid: 1.8 L  Skin: stage 2 sacral pressure ulcer  Diet Order: NPO  EDUCATION  NEEDS: -Education not appropriate at this time   Intake/Output Summary (Last 24 hours) at 12/20/13 1406 Last data filed at 12/20/13 1338  Gross per 24 hour  Intake   3582 ml  Output    855 ml  Net   2727 ml    Last BM: 7/21   Labs:   Recent Labs Lab 12/19/13 1710 12/20/13 0500  NA 140 138  K 3.0* 3.6*  CL 105 98  CO2 22 25  BUN 106* 121*  CREATININE 1.26* 1.51*  CALCIUM 7.2* 8.7  MG 2.3  --   PHOS 3.6  --   GLUCOSE 91 115*    CBG (last 3)   Recent Labs  12/20/13 0507 12/20/13 0819 12/20/13 1217  GLUCAP 85 72 118*    Scheduled Meds: . antiseptic oral rinse  15 mL Mouth Rinse QID  . ceFEPime (MAXIPIME) IV  1 g Intravenous Q24H  . chlorhexidine  15 mL Mouth/Throat BID  . feeding supplement (PRO-STAT SUGAR FREE 64)  30 mL Per Tube BID  . feeding supplement (VITAL HIGH PROTEIN)  1,000 mL Per Tube Q24H  . hydrALAZINE  50 mg Per Tube 3 times per day  . levETIRAcetam  500 mg Per Tube BID  . minoxidil  5 mg Per Tube Daily  . pantoprazole (PROTONIX) IV  40 mg Intravenous Q12H    Continuous Infusions: . dextrose 5 % and 0.45% NaCl 10 mL/hr at 12/20/13 1017    Past Medical History  Diagnosis Date  . Chronic respiratory failure   . Tracheostomy status   .  Persist vegetative state   . 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 Type II diabetes mellitus   . HTN (hypertension)   . Anemia, unspecified   . CKD (chronic kidney disease) stage 2, GFR 60-89 ml/min   . Corneal abscess     history of   . Gastro-esophageal reflux   . Chronic constipation   . 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 Peg tube placement      unknown insertion date   . Tracheostomy      unknown insertion site     Joaquin CourtsKimberly San Lohmeyer, RD, LDN, CNSC Pager [phone] After Hours Pager [phone]

## 2013-12-20 NOTE — Progress Notes (Signed)
Hypoglycemic Event  CBG: 61  Treatment: 1/2 amp d0  Symptoms: none  Follow-up CBG: Time:0505 CBG Result:85  Possible Reasons for Event:  npo  Comments/MD notified: d51/2ns @75  started    [name], [name]  Remember to initiate Hypoglycemia Order Set & complete

## 2013-12-20 NOTE — Care Management Note (Signed)
    Page 1 of 1   12/22/2013     3:32:37 PM CARE MANAGEMENT NOTE 12/22/2013  Patient:  [name],[name]   Account Number:  [id number]  Date Initiated:  12/20/2013  Documentation initiated by:  Avie ArenasBROWN,SARAH  Subjective/Objective Assessment:   Admitted with anemia     Action/Plan:   Anticipated DC Date:  12/22/2013   Anticipated DC Plan:  SKILLED NURSING FACILITY  In-house referral  Clinical Social Worker      DC Planning Services  CM consult      Choice offered to / List presented to:             Status of service:  Completed, signed off Medicare Important Message given?  NA - LOS <3 / Initial given by admissions (If response is "NO", the following Medicare IM given date fields will be blank) Date Medicare IM given:   Medicare IM given by:   Date Additional Medicare IM given:   Additional Medicare IM given by:    Discharge Disposition:  SKILLED NURSING FACILITY  Per UR Regulation:  Reviewed for med. necessity/level of care/duration of stay  If discussed at Long Length of Stay Meetings, dates discussed:    Comments:  ContactTheone Stanley:    [name],[name]       [phone]  12/21/13 0930 [name] RN MSN BSN CCM Per attending, pt stable for return to SNF, contacted [facility] liaison who states pt has a bed.  CSW notified.  12-20-13 1:40pm Avie ArenasSarah Brown, RNBSN [phone] Came from [facility] with anemia - Contacted SW of admission - Physician states depending on progression - may be able to return within 48 hours.

## 2013-12-20 NOTE — Progress Notes (Signed)
Hypoglycemic Event  CBG: 48  Treatment:  1 amp d50  Symptoms: none  Follow-up CBG: Time:0020 CBG Result:117  Possible Reasons for Event: pt npo  Comments/MD notified:    Eilleen KempfMoore, Min Tunnell Elizabeth  Remember to initiate Hypoglycemia Order Set & complete

## 2013-12-20 NOTE — Consult Note (Addendum)
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     Goals of Care: 1.  Code Status: Full  2. Goals of Care: Met with [name] this afternoon.  She talked extensively about her mothers struggles over past several months and estimates that she has been re-hospitalized ~4 times with complications related to her neurologic injury.  She has had multiple discussions with multiple physicians surrounding goals of care.  This has been difficult for [name].  She reports some physicians telling her that she is being sellfish and having very strong opinions that continuing aggressive life-sustaining treatments may be inappropriate.  She reports having "sick feeling" in her stomach when her mom is hospitalized because she has to re-hash these conversations about code status and overall goals.  She has met with several neurologists at [facility] who have given her mother a poor prognosis for neurologic recovery. "a less than 1% chance that she has significant neurologic recovery".  [name] also reports being told that her mom would never have a gag reflex, open her eyes, or come of breathing machines which [name] reports she has done.  [name] states that vent was just started again 1 month ago ([facility] reports that [facility] has [facility] since admission there in May).  [name] and her family all pray for a miracle to happen with her mom because she "wanted so badly to live".  She has been asked what her mom would say about this situation in the past, and believes her mom would want to give god every chance to heal her and to live again. [name] feels that is important to her faith and her mothers that all medical care  continue until it is "gods time for her".  [name] reports that this event has lead to her and her sister [name] "being saved" themselves.    Ultimately challenging case which [name] is able to even voice distress other physicians have had about her mothers care.  Would suggest multidisciplinary meeting to discuss treatment decisions related to [name]'s care whether to pursue or withhold aspects of care.  It seems unlikely that family will abruptly change goals of care with solitary meetings and will be a long-term process.  If care plans can not be negotiated or agreed upon through family meetings, providers are certainly not obligated to provide care that they feel to be inappropriate.  Ethics should be consulted in such cases and conflict resolution process should be followed.     3. Symptom Management:   1. Secretions- was on scopolamine patch at [facility]. May need to consider to help control.    4. Psychosocial: 2 daughters [name] [phone], [place] and [place] [phone])  5. Spiritual: Very spiritual family.  Believe that god will take her when it is her time but need to continue to have faith/hope in miracle.  Family has good church support at home and have gotten to know spiritual community some in [place] area as well.     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  Patient is transferred to [facility] hospital from [facility] for anemia, concern for HCAP, as well as AKI.  Patient is unable to provide any history.    I was able to reach her daughter [name] via phone today who states that Mairyn's care initially began at [facility] after her stroke.  She had tracheostomy placed there and then was [facility].  Her daughter was reportedly told at [facility] that her mom would never interact, never have gag reflex, never open eyes again.  D[facility] reports that at [facility] however, she made  progress to point of coming off ventilator, opening eyes, and having gag reflex which she feels she was told would never happen.  Daughter tells me that she has had several hospital admissions to [facility] for complications related to infections and respiratory issues.  She worries that this may be due to poor suctioning and oral care at long-term care facilities because she always gets "better" at the hospital.  [name] reports that her mom has been at [facility] for past several months and only needed to go back on vent ~1 month ago. She again worries that this may be related to poor suctioning at [facility]. She asked that her mom be transferred to ICU after several attempts at blood transfusion did not improve her anemia and there was not a likely explanation.  In speaking with nursing at [facility] today, patient has been at [facility] since 10/20/13.  They have transfused Nani several times 2/2 her anemia without much improvement in her Hgb since the beginning of the month.  Multiple discussions around pursuing possible comfort care have occurred with daughter, but she has elected to pursue aggressive care despite poor prognosis.      ROS: Unable to obtain 2/2 cognitive impairment    PMH:  Past Medical History  Diagnosis Date  . Chronic respiratory failure   . Tracheostomy status   . Persist vegetative state   . 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 Type II diabetes mellitus   . HTN (hypertension)   . Anemia, unspecified   . CKD (chronic kidney disease) stage 2, GFR 60-89 ml/min   . Corneal abscess     history of   . Gastro-esophageal reflux   . Chronic constipation   . 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 Peg tube placement      unknown insertion date   . Tracheostomy      unknown insertion site   I have reviewed the FH and SH and  If appropriate update  it with new information. Allergies  Allergen Reactions  . Calcium Channel Blockers Other (See Comments)    unkown  . Haldol [Haloperidol Lactate] Other (See Comments)    unknown  . Hctz [Hydrochlorothiazide] Other (See Comments)    unknown  . Lopressor [Metoprolol Tartrate] Other (See Comments)    unknown   Scheduled Meds: . antiseptic oral rinse  15 mL Mouth Rinse QID  . ceFEPime (MAXIPIME) IV  1 g Intravenous Q24H  . chlorhexidine  15 mL Mouth/Throat BID  . feeding supplement (PRO-STAT SUGAR FREE 64)  30 mL Per Tube BID  . feeding supplement (VITAL HIGH PROTEIN)  1,000 mL Per Tube Q24H  . hydrALAZINE  50 mg Per Tube 3 times per day  . levETIRAcetam  500 mg Per Tube BID  .  minoxidil  5 mg Per Tube Daily  . pantoprazole (PROTONIX) IV  40 mg Intravenous Q12H  . phytonadione (VITAMIN K) IV  10 mg Intravenous Once   Continuous Infusions: . dextrose 5 % and 0.45% NaCl 10 mL/hr at 12/20/13 1017   PRN Meds:.sodium chloride    BP 97/46  Pulse 79  Temp(Src) 97.6 F (36.4 C) (Oral)  Resp 19  Ht 5' 5"  (1.651 m)  Wt 85 kg (187 lb 6.3 oz)  BMI 31.18 kg/m2  SpO2 97%   PPS: 30   Intake/Output Summary (Last 24 hours) at 12/20/13 1201 Last data filed at 12/20/13 1100  Gross per 24 hour  Intake   3562 ml  Output    705 ml  Net   2857 ml    Physical Exam:  General: No apparent distress HEENT:  [place], mmm, pupils non-reactive and dilated.  Chest:   Symmetrical expansion, scattered coarse sounds CVS: RRR Abdomen: soft, ND Ext: warm/dry Neuro: No purposeful interaction with me, not able to follow commands. No pupillary reflex, lash reflex on right only.   Labs: CBC    Component Value Date/Time   WBC 11.6* 12/20/2013 0500   RBC 2.56* 12/20/2013 0500   RBC 2.10* 12/19/2013 1710   HGB 7.5* 12/20/2013 0500   HCT 23.3* 12/20/2013 0500   PLT 225 12/20/2013 0500   MCV 91.0 12/20/2013 0500   MCH 29.3 12/20/2013 0500   MCHC 32.2 12/20/2013 0500   RDW 18.1* 12/20/2013 0500   LYMPHSABS  0.6* 12/19/2013 1710   MONOABS 0.5 12/19/2013 1710   EOSABS 0.2 12/19/2013 1710   BASOSABS 0.0 12/19/2013 1710    BMET    Component Value Date/Time   NA 138 12/20/2013 0500   K 3.6* 12/20/2013 0500   CL 98 12/20/2013 0500   CO2 25 12/20/2013 0500   GLUCOSE 115* 12/20/2013 0500   BUN 121* 12/20/2013 0500   CREATININE 1.51* 12/20/2013 0500   CALCIUM 8.7 12/20/2013 0500   GFRNONAA 37* 12/20/2013 0500   GFRAA 43* 12/20/2013 0500    CMP     Component Value Date/Time   NA 138 12/20/2013 0500   K 3.6* 12/20/2013 0500   CL 98 12/20/2013 0500   CO2 25 12/20/2013 0500   GLUCOSE 115* 12/20/2013 0500   BUN 121* 12/20/2013 0500   CREATININE 1.51* 12/20/2013 0500   CALCIUM 8.7 12/20/2013 0500   PROT 6.3 12/19/2013 1710   ALBUMIN 1.7* 12/19/2013 1710   AST 57* 12/19/2013 1710   ALT 68* 12/19/2013 1710   ALKPHOS 334* 12/19/2013 1710   BILITOT 1.0 12/19/2013 1710   GFRNONAA 37* 12/20/2013 0500   GFRAA 43* 12/20/2013 0500    Chest Xray Reviewed/Impressions:  12/20/13 IMPRESSION:  Diffuse airspace and interstitial densities bilaterally. No  significant change from the previous examination.  Stable support apparatuses.    Total time 85 minutes  Greater than 50%  of this time was spent counseling and coordinating care related to the above assessment and plan.     Doran Clay D.O. Palliative Medicine Team at [facility]  Pager: [phone] Team Phone: [phone]

## 2013-12-20 NOTE — Progress Notes (Signed)
RT assisted in transfer of [facility] on 100% 02- uneventful. RN at bedside. Current vent settings in EPIC.

## 2013-12-20 NOTE — Progress Notes (Signed)
eLink Physician-Brief Progress Note Patient Name: [name] DOB: 08-04-1955 MRN: [id number]  Date of Service  12/20/2013   HPI/Events of Note     eICU Interventions  IVF changed to d5+0.45ns for hypoglycemia   Intervention Category Intermediate Interventions: Electrolyte abnormality - evaluation and management  Bonnie Hinchey S. 12/20/2013, 4:49 AM

## 2013-12-21 DIAGNOSIS — D649 Anemia, unspecified: Secondary | ICD-10-CM | POA: Diagnosis not present

## 2013-12-21 LAB — CBC
HCT: 24.3 % — ABNORMAL LOW (ref 36.0–46.0)
Hemoglobin: 7.7 g/dL — ABNORMAL LOW (ref 12.0–15.0)
MCH: 29.3 pg (ref 26.0–34.0)
MCHC: 31.7 g/dL (ref 30.0–36.0)
MCV: 92.4 fL (ref 78.0–100.0)
Platelets: 250 10*3/uL (ref 150–400)
RBC: 2.63 MIL/uL — ABNORMAL LOW (ref 3.87–5.11)
RDW: 18.8 % — AB (ref 11.5–15.5)
WBC: 11.7 10*3/uL — ABNORMAL HIGH (ref 4.0–10.5)

## 2013-12-21 LAB — PROTIME-INR
INR: 1.31 (ref 0.00–1.49)
Prothrombin Time: 16.3 seconds — ABNORMAL HIGH (ref 11.6–15.2)

## 2013-12-21 LAB — GLUCOSE, CAPILLARY
GLUCOSE-CAPILLARY: 140 mg/dL — AB (ref 70–99)
GLUCOSE-CAPILLARY: 167 mg/dL — AB (ref 70–99)
GLUCOSE-CAPILLARY: 134 mg/dL — AB (ref 70–99)
Glucose-Capillary: 147 mg/dL — ABNORMAL HIGH (ref 70–99)
Glucose-Capillary: 181 mg/dL — ABNORMAL HIGH (ref 70–99)
Glucose-Capillary: 233 mg/dL — ABNORMAL HIGH (ref 70–99)

## 2013-12-21 LAB — BASIC METABOLIC PANEL
Anion gap: 14 (ref 5–15)
BUN: 119 mg/dL — ABNORMAL HIGH (ref 6–23)
CHLORIDE: 101 meq/L (ref 96–112)
CO2: 27 mEq/L (ref 19–32)
CREATININE: 1.41 mg/dL — AB (ref 0.50–1.10)
Calcium: 8.9 mg/dL (ref 8.4–10.5)
GFR calc non Af Amer: 40 mL/min — ABNORMAL LOW (ref >90)
GFR, EST AFRICAN AMERICAN: 47 mL/min — AB (ref >90)
GLUCOSE: 201 mg/dL — AB (ref 70–99)
Potassium: 3.7 mEq/L (ref 3.7–5.3)
Sodium: 142 mEq/L (ref 137–147)

## 2013-12-21 LAB — VANCOMYCIN, RANDOM: Vancomycin Rm: 30.5 ug/mL

## 2013-12-21 LAB — PROCALCITONIN: Procalcitonin: 0.97 ng/mL

## 2013-12-21 MED ORDER — HYDRALAZINE HCL 25 MG PO TABS: 25.0000 mg | ORAL_TABLET | Freq: Three times a day (TID) | ORAL | Status: DC

## 2013-12-21 MED ORDER — SCOPOLAMINE 1 MG/3DAYS TD PT72
1.0000 | MEDICATED_PATCH | TRANSDERMAL | Status: DC
  Administered 2013-12-21: 1.5 mg via TRANSDERMAL
  Filled 2013-12-21: qty 1

## 2013-12-21 MED ORDER — INSULIN GLARGINE 100 UNIT/ML ~~LOC~~ SOLN: 5.0000 [IU] | Freq: Every day | SUBCUTANEOUS | Status: DC

## 2013-12-21 MED ORDER — DEXTROSE 5 % IV SOLN: 1.0000 g | INTRAVENOUS | Status: AC

## 2013-12-21 MED ORDER — INSULIN ASPART 100 UNIT/ML ~~LOC~~ SOLN
0.0000 [IU] | SUBCUTANEOUS | Status: DC
Start: 2013-12-21 — End: 2013-12-22
  Administered 2013-12-21: 3 [IU] via SUBCUTANEOUS
  Administered 2013-12-21: 2 [IU] via SUBCUTANEOUS

## 2013-12-21 MED ORDER — VITAL HIGH PROTEIN PO LIQD: ORAL | Status: AC

## 2013-12-21 MED ORDER — INSULIN GLARGINE 100 UNIT/ML ~~LOC~~ SOLN
5.0000 [IU] | Freq: Every day | SUBCUTANEOUS | Status: DC
  Filled 2013-12-21: qty 0.05

## 2013-12-21 MED ORDER — HYDRALAZINE HCL 25 MG PO TABS
25.0000 mg | ORAL_TABLET | Freq: Three times a day (TID) | ORAL | Status: DC
  Administered 2013-12-21: 25 mg
  Filled 2013-12-21 (×3): qty 1

## 2013-12-21 NOTE — Progress Notes (Deleted)
PULMONARY / CRITICAL CARE MEDICINE  Name: [name] MRN: [id number] DOB: [birth year]    ADMISSION DATE: 12/19/2013 CONSULTATION DATE: 12/19/2013  REFERRING MD: [facility]   CHIEF COMPLAINT: Anemia / renal failure  INITIAL PRESENTATION: 58 yo female with recent ICH complicated by respiratory failure requiring tracheostomy and chronic vent support transfered from [facility] with anemia requiring transfusion, possible HCAP and progressive azotemia.  STUDIES / EVENTS:  LINES: Trach PEG R UE PICC Foley  CULTURES: 7/21  Respiratory >>>normal flora>> 7/21  Urine >>>mult morph, recollect>>> 7/21  Blood >>>  ANTIBIOTICS: Cefepime 7/20 >>>7/22 Vancomycin 7/20 >>>  SUBJECTIVE: Stable vegetative state, hemoglobin stable.    VITAL SIGNS: Temp:  [96.7 F (35.9 C)-98.5 F (36.9 C)] 98.4 F (36.9 C) (07/23 1136) Pulse Rate:  [77-99] 82 (07/23 1203) Resp:  [7-31] 19 (07/23 1203) BP: (95-129)/(52-67) 117/55 mmHg (07/23 1203) SpO2:  [96 %-100 %] 97 % (07/23 1136) FiO2 (%):  [60 %-80 %] 60 % (07/23 1203) Weight:  [199 lb 4.7 oz (90.4 kg)] 199 lb 4.7 oz (90.4 kg) (07/22 1911)  HEMODYNAMICS:   VENTILATOR SETTINGS: Vent Mode:  [-] PRVC FiO2 (%):  [60 %-80 %] 60 % Set Rate:  [18 bmp] 18 bmp Vt Set:  [450 mL] 450 mL PEEP:  [5 cmH20-12 cmH20] 12 cmH20 Plateau Pressure:  [27 cmH20-34 cmH20] 27 cmH20 INTAKE / OUTPUT: Intake/Output     07/22 0701 - 07/23 0700 07/23 0701 - 07/24 0700   I.V. (mL/kg) 330 (3.7)    Blood     NG/GT 405    IV Piggyback 50    Total Intake(mL/kg) 785 (8.7)    Urine (mL/kg/hr) 1130 (0.5) 400 (0.8)   Total Output 1130 400   Net -345 -400        Stool Occurrence 2 x      PHYSICAL EXAMINATION: General: Chronically ill appearing female Neuro: Unresponsive, dilated nonreactive pupils, no purposeful movement noted HEENT: Trach in place Cardiovascular: Regular, no murmurs Lungs: Scattered rhonchi Abdomen: Firm, non-tender, Peg  Musculoskeletal: 1+ pitting edema  b/l hands and LEs, poor muscle tone Skin: Stage II on sacrum  LABS:  CBC  Recent Labs Lab 12/19/13 2310 12/20/13 0500 12/21/13 0500  WBC 12.4* 11.6* 11.7*  HGB 7.6* 7.5* 7.7*  HCT 23.5* 23.3* 24.3*  PLT 210 225 250   Coag's  Recent Labs Lab 12/19/13 1710 12/21/13 0500  APTT 51*  --   INR 1.63* 1.31   BMET  Recent Labs Lab 12/19/13 1710 12/20/13 0500 12/21/13 1115  NA 140 138 142  K 3.0* 3.6* 3.7  CL 105 98 101  CO2 22 25 27   BUN 106* 121* 119*  CREATININE 1.26* 1.51* 1.41*  GLUCOSE 91 115* 201*   Electrolytes  Recent Labs Lab 12/19/13 1710 12/20/13 0500 12/21/13 1115  CALCIUM 7.2* 8.7 8.9  MG 2.3  --   --   PHOS 3.6  --   --    Sepsis Markers  Recent Labs Lab 12/19/13 1532 12/19/13 1710 12/20/13 0500 12/21/13 0500  LATICACIDVEN  --  0.9  --   --   PROCALCITON 1.06  --  1.16 0.97   ABG  Recent Labs Lab 12/19/13 1638 12/20/13 0355  PHART 7.412 7.368  PCO2ART 47.2* 48.5*  PO2ART 54.0* 58.9*   Liver Enzymes  Recent Labs Lab 12/19/13 1710  AST 57*  ALT 68*  ALKPHOS 334*  BILITOT 1.0  ALBUMIN 1.7*   Cardiac Enzymes  Recent Labs Lab 12/19/13 1710  PROBNP 17649.0*  Glucose  Recent Labs Lab 12/20/13 1217 12/20/13 2039 12/21/13 0021 12/21/13 0510 12/21/13 0804 12/21/13 1133  GLUCAP 118* 120* 147* 140* 167* 233*   IMAGING:   Dg Chest Port 1 View  12/20/2013   CLINICAL DATA:  Tracheostomy.  EXAM: PORTABLE CHEST - 1 VIEW  COMPARISON:  12/19/2013  FINDINGS: PICC line tip in the SVC region. Patient has a tracheostomy tube. Bilateral interstitial and airspace densities have minimally changed. Heart size is within normal limits. Negative for a pneumothorax.  IMPRESSION: Diffuse airspace and interstitial densities bilaterally. No significant change from the previous examination.  Stable support apparatuses.   Electronically Signed   By: Richarda Overlie M.D.   On: 12/20/2013 08:21   Dg Chest Port 1 View  12/19/2013   CLINICAL DATA:   Congestion, evaluate for pneumonia  EXAM: PORTABLE CHEST - 1 VIEW  COMPARISON:  None.  FINDINGS: Tracheostomy tube projects over the tracheal air column. Right-sided decline projects with tip over the cavoatrial junction.  Mild cardiac enlargement. Severe bilateral diffuse airspace disease with mixed interstitial and alveolar opacities. No significant pleural effusion.  IMPRESSION: Extensive bilateral airspace disease. Acute pulmonary edema is a consideration. Other possibilities include extensive bilateral pneumonia, alveolar hemorrhage, ARDS.   Electronically Signed   By: Esperanza Heir M.D.   On: 12/19/2013 15:50   ASSESSMENT / PLAN:  PULMONARY A: Chronic respiratory failure Tracheostomy status  Possible HCAP vs edema P:   Cont chronic vent support  F/u cxr  Hold further abg for now    CARDIOVASCULAR A:  HTN P:  Cont minoxidil  Decrease hydralazine to 25mg  TID with marginal BP Telemetry   RENAL A:   CKD BUN elevation -- contribution from GI bleed P:   KVO IVF  HD / cvvhd would be futile and medically ineffective  GASTROINTESTINAL A:   GERD Constipation PEG status GI Px P:   Cont TF per nutrition  Miralax PPI   HEMATOLOGIC A:  Acute on chronic anemia, FOBT neg Coagulopathy, INR 1.63, PTT 51 unclear, lovenox in setting arf? P:  Trend CBC Hold any further anticoagulation  scd Consider GI consult if hgb drops further   INFECTIOUS A:  Fevers reported from [facility] R/o HCAP, PCT 1.06->1.16 P:   Cx/ abx as above Follow micro Re-collect urine culture 7/23  ENDOCRINE A:   Diabetes Hypoglycemia - resolved with TF P:   D/c D5  SSI Resume low dose lantus   NEUROLOGIC A:   S/p intracranial hemorrhage Persistent vegetative state Seizures P:   Keppra  Any forms of life support are medically ineffective and futile. Will consult palliative care to consider futility policy if family requests aggressive care.  Likely back to [facility] in next 24-48  hours.   Dirk Dress, NP 12/21/2013  12:28 PM Pager: [phone] or [phone]  *Care during the described time interval was provided by me and/or other providers on the critical care team. I have reviewed this patient's available data, including medical history, events of note, physical examination and test results as part of my evaluation.

## 2013-12-21 NOTE — Progress Notes (Signed)
Covering CSW informed that pt is ready for dc back to [facility]. CSW confirmed with Kennyth ArnoldStacy from [facility] that pt is able to return today.  CSW to await for dc paperwork to be completed and FL2 to be signed on pt chart.  Theresia BoughNorma Amritpal Shropshire, MSW, LCSW [phone]

## 2013-12-21 NOTE — ED Notes (Signed)
Message left for [facility] admission coordinator re: return to SNF today.  Await return call.

## 2013-12-21 NOTE — Discharge Summary (Signed)
Physician Discharge Summary  Patient ID: [name] MRN: [id number] DOB/AGE: 1956/04/26 58 y.o.  Admit date: 12/19/2013 Discharge date: 12/21/2013    Discharge Diagnoses:  Active Problems:   Chronic respiratory failure   Tracheostomy status   Persist vegetative state   Type II diabetes mellitus   HTN (hypertension)   Anemia, unspecified   CKD (chronic kidney disease) stage 2, GFR 60-89 ml/min   Gastro-esophageal reflux   Dysphagia status post cerebrovascular accident   Azotemia    Brief Summary: [name] is a 58 yo female with recent ICH complicated by respiratory failure requiring tracheostomy and chronic vent support transfered 7/21 from [facility] with anemia requiring transfusion, possible HCAP and progressive azotemia.  She was admitted by PCCM and transfused with 1 unit PRBC.  FOBT negative x 2.  Hgb rose appropriately and has remained stable since. BUN has been elevated thought r/t ?GI source for anemia but FOBT has been neg and no obvious s/s bleeding.  She was also treated with empiric Cefepime and Vancomycin (previously started at [place]) for presumed infection although cultures remain negative to date.  Creatinine deteriorated slightly but is improving and is likely near baseline in setting CKD.  HD in general would be consider medically ineffective / futile therapy in this ventilator-dependent patient in persistent vegetative state.  Vent weaning has been deferred for now but can be resumed as tolerated.  Doubt she will be completely liberated from vent.    LINES:  Trach  PEG  R UE PICC  Foley   CULTURES:  7/21 Respiratory >>>normal flora>>  7/21 Urine >>>mult morph  7/21 Blood >>> ngtd>>>  ANTIBIOTICS:  Cefepime 7/20 >>> planned through 7/28 Vancomycin 7/20 >>>planned through 7/28                                                                    Hospital Summary by Discharge Diagnosis   Chronic respiratory failure  Tracheostomy status  Possible HCAP vs  edema  P:  Goal SpO2>92  Continuous vent support  Defer weaning for now, resume at SNF   HTN  P:  Hydralazine, Minoxidil    Azotemia, likely exacerbated by GI hemorrhage  CKD  P:  Trend BMP  No indications for urgent HD  HD in general would be consider medically ineffective / futile therapy in this ventilator-dependent patient in persistent vegetative state    GERD  Constipation  PEG status  GI Px  No evidence thus far bleeding  P:  TF per Nutritionist  Miralax  Protonix    Acute on chronic anemia, FOBT neg  Coagulopathy, corrected with Vit K  P:  Trend CBC  Avoid anticoagulation  SCD    Suspected HCAP  P:  Cont 8 days total abx as above  Monitor vanc levels with elevated SCr    Diabetes  Hypoglycemia  Hyperglycemia  P:  SSI, sensitive scale  Low dose lantus   S/p intracranial hemorrhage  Persistent vegetative state  Seizures  P:  Keppra    Filed Vitals:   12/21/13 1136 12/21/13 1203 12/21/13 1434 12/21/13 1530  BP: 129/61 117/55 126/58 124/55  Pulse: 84 82  93  Temp: 98.4 F (36.9 C)   98.9 F (37.2 C)  TempSrc: Oral   Oral  Resp: 16 19  16   Height:      Weight:      SpO2: 97%   99%     Discharge Labs  BMET  Recent Labs Lab 12/19/13 1710 12/20/13 0500 12/21/13 1115  NA 140 138 142  K 3.0* 3.6* 3.7  CL 105 98 101  CO2 22 25 27   GLUCOSE 91 115* 201*  BUN 106* 121* 119*  CREATININE 1.26* 1.51* 1.41*  CALCIUM 7.2* 8.7 8.9  MG 2.3  --   --   PHOS 3.6  --   --      CBC   Recent Labs Lab 12/19/13 2310 12/20/13 0500 12/21/13 0500  HGB 7.6* 7.5* 7.7*  HCT 23.5* 23.3* 24.3*  WBC 12.4* 11.6* 11.7*  PLT 210 225 250   Anti-Coagulation  Recent Labs Lab 12/19/13 1710 12/21/13 0500  INR 1.63* 1.31         Medication List    STOP taking these medications       vancomycin 1,000 mg in sodium chloride 0.9 % 250 mL      TAKE these medications       acetaminophen 160 MG/5ML liquid  Commonly known as:   TYLENOL  Place 650 mg into feeding tube every 6 (six) hours as needed for fever.     atropine 1 % ophthalmic solution  Place 1 drop into both eyes 3 (three) times daily.     carboxymethylcellulose 0.5 % Soln  Commonly known as:  REFRESH PLUS  Apply 2 drops to eye every 8 (eight) hours as needed (dry irritated eyes).     ceFEPIme 1 g in dextrose 5 % 50 mL  Inject 1 g into the vein daily.     chlorhexidine 0.12 % solution  Commonly known as:  PERIDEX  Use as directed 15 mLs in the mouth or throat 2 (two) times daily. For trach tube     docusate 50 MG/5ML liquid  Commonly known as:  COLACE  Place 100 mg into feeding tube daily.     feeding supplement (VITAL HIGH PROTEIN) Liqd liquid  52m/hr     ferrous sulfate 300 (60 FE) MG/5ML syrup  Place 300 mg into feeding tube every 12 (twelve) hours.     guaiFENesin 100 MG/5ML liquid  Commonly known as:  ROBITUSSIN  Place 200 mg into feeding tube every 4 (four) hours as needed (thick secretions).     hydrALAZINE 25 MG tablet  Commonly known as:  APRESOLINE  Place 1 tablet (25 mg total) into feeding tube every 8 (eight) hours.     insulin glargine 100 UNIT/ML injection  Commonly known as:  LANTUS  Inject 0.05 mLs (5 Units total) into the skin at bedtime.     insulin regular 100 units/mL injection  Commonly known as:  NOVOLIN R,HUMULIN R  - Inject 0-5 Units into the skin 3 (three) times daily before meals. 0-150= 0 units  - 151-200= 1 unit  - 201-250= 2 units  - 251-300= 3 units  - 301-350= 4 units  - 351-400=5 units  - >400 call physician     levETIRAcetam 100 MG/ML solution  Commonly known as:  KEPPRA  Place 500 mg into feeding tube every 12 (twelve) hours.     Lidocaine 0.5 % Aero  Take 2 mLs by nebulization every 4 (four) hours as needed (cough related to Chronic Respiratory Failure).     minoxidil 2.5 MG tablet  Commonly known as:  LONITEN  Place 2.5 mg into  feeding tube daily.     polyethylene glycol packet   Commonly known as:  MIRALAX / GLYCOLAX  Place 17 g into feeding tube every 12 (twelve) hours.     PREVACID SOLUTAB 30 MG disintegrating tablet  Generic drug:  lansoprazole  Place 30 mg into feeding tube daily.     scopolamine 1 MG/3DAYS  Commonly known as:  TRANSDERM-SCOP  Place 1 patch onto the skin every 3 (three) days.     sennosides 8.8 MG/5ML syrup  Commonly known as:  SENOKOT  Place 10 mLs into feeding tube at bedtime.         Disposition: Vent SNF   Discharged Condition: [name] has met maximum benefit of inpatient care and is medically stable and cleared for discharge.  Patient is pending follow up as above.      Time spent on disposition:  Greater than 35 minutes.   SignedDarlina Sicilian, NP 12/21/2013  3:36 PM Pager: [phone] or [phone]  *Care during the described time interval was provided by me and/or other providers on the critical care team. I have reviewed this patient's available data, including medical history, events of note, physical examination and test results as part of my evaluation.

## 2013-12-21 NOTE — Progress Notes (Signed)
CSW arranged transportation to [facility] via [facility].  Pt's daughter called to inform her of tx.  No answer.  Message left.

## 2013-12-21 NOTE — Progress Notes (Signed)
PULMONARY / CRITICAL CARE MEDICINE  Name: [name] MRN: [id number] DOB: December 13, 1955    ADMISSION DATE: 12/19/2013 CONSULTATION DATE: 12/19/2013  REFERRING MD: [facility]   CHIEF COMPLAINT: Anemia / renal failure  INITIAL PRESENTATION: 58 yo female with recent ICH complicated by respiratory failure requiring tracheostomy transfered from [facility] with anemia requiring transfusion, possible HCAP and progressive azotemia.  STUDIES / EVENTS:  LINES: Trach PEG R UE PICC Foley  CULTURES: 7/21  Respiratory >>> npof 7/21  Urine >>> multiple morphotypes   7/21  Blood >>>  ANTIBIOTICS: Cefepime 7/20 >>> Vancomycin 7/20 >>>  SUBJECTIVE:  No events overnight  VITAL SIGNS: Temp:  [96.7 F (35.9 C)-98.5 F (36.9 C)] 97.8 F (36.6 C) (07/23 0800) Pulse Rate:  [77-99] 88 (07/23 0933) Resp:  [7-31] 28 (07/23 0933) BP: (95-123)/(52-67) 115/59 mmHg (07/23 1038) SpO2:  [96 %-100 %] 100 % (07/23 0933) FiO2 (%):  [75 %-80 %] 80 % (07/23 0933) Weight:  [90.4 kg (199 lb 4.7 oz)] 90.4 kg (199 lb 4.7 oz) (07/22 1911)  HEMODYNAMICS:   VENTILATOR SETTINGS: Vent Mode:  [-] PRVC FiO2 (%):  [75 %-80 %] 80 % Set Rate:  [18 bmp] 18 bmp Vt Set:  [450 mL] 450 mL PEEP:  [5 cmH20-12 cmH20] 12 cmH20 Plateau Pressure:  [28 cmH20-34 cmH20] 32 cmH20 INTAKE / OUTPUT: Intake/Output     07/22 0701 - 07/23 0700 07/23 0701 - 07/24 0700   I.V. (mL/kg) 330 (3.7)    Blood     NG/GT 405    IV Piggyback 50    Total Intake(mL/kg) 785 (8.7)    Urine (mL/kg/hr) 1130 (0.5) 200 (0.5)   Total Output 1130 200   Net -345 -200        Stool Occurrence 2 x      PHYSICAL EXAMINATION: General: No distress Neuro: Coma HEENT: Tracheostomy site intact Cardiovascular: Regular, no murmurs Lungs: Bilateral air movement, rhonchi Abdomen: Soft, PEG site intact, bowel sounds diminished Musculoskeletal: Trace edema U/L E Skin: Sacral decubitus ulcer  LABS:  CBC  Recent Labs Lab 12/19/13 2310 12/20/13 0500  12/21/13 0500  WBC 12.4* 11.6* 11.7*  HGB 7.6* 7.5* 7.7*  HCT 23.5* 23.3* 24.3*  PLT 210 225 250   Coag's  Recent Labs Lab 12/19/13 1710 12/21/13 0500  APTT 51*  --   INR 1.63* 1.31   BMET  Recent Labs Lab 12/19/13 1710 12/20/13 0500 12/21/13 1115  NA 140 138 142  K 3.0* 3.6* 3.7  CL 105 98 101  CO2 22 25 27   BUN 106* 121* 119*  CREATININE 1.26* 1.51* 1.41*  GLUCOSE 91 115* 201*   Electrolytes  Recent Labs Lab 12/19/13 1710 12/20/13 0500  CALCIUM 7.2* 8.7  MG 2.3  --   PHOS 3.6  --    Sepsis Markers  Recent Labs Lab 12/19/13 1532 12/19/13 1710 12/20/13 0500 12/21/13 0500  LATICACIDVEN  --  0.9  --   --   PROCALCITON 1.06  --  1.16 0.97   ABG  Recent Labs Lab 12/19/13 1638 12/20/13 0355  PHART 7.412 7.368  PCO2ART 47.2* 48.5*  PO2ART 54.0* 58.9*   Liver Enzymes  Recent Labs Lab 12/19/13 1710  AST 57*  ALT 68*  ALKPHOS 334*  BILITOT 1.0  ALBUMIN 1.7*   Cardiac Enzymes  Recent Labs Lab 12/19/13 1710  PROBNP 17649.0*   Glucose  Recent Labs Lab 12/20/13 0819 12/20/13 1217 12/20/13 2039 12/21/13 0021 12/21/13 0510 12/21/13 0804  GLUCAP 72 118* 120* 147* 140*  167*   IMAGING:   Dg Chest Port 1 View  12/20/2013   CLINICAL DATA:  Tracheostomy.  EXAM: PORTABLE CHEST - 1 VIEW  COMPARISON:  12/19/2013  FINDINGS: PICC line tip in the SVC region. Patient has a tracheostomy tube. Bilateral interstitial and airspace densities have minimally changed. Heart size is within normal limits. Negative for a pneumothorax.  IMPRESSION: Diffuse airspace and interstitial densities bilaterally. No significant change from the previous examination.  Stable support apparatuses.   Electronically Signed   By: Richarda Overlie M.D.   On: 12/20/2013 08:21   Dg Chest Port 1 View  12/19/2013   CLINICAL DATA:  Congestion, evaluate for pneumonia  EXAM: PORTABLE CHEST - 1 VIEW  COMPARISON:  None.  FINDINGS: Tracheostomy tube projects over the tracheal air column.  Right-sided decline projects with tip over the cavoatrial junction.  Mild cardiac enlargement. Severe bilateral diffuse airspace disease with mixed interstitial and alveolar opacities. No significant pleural effusion.  IMPRESSION: Extensive bilateral airspace disease. Acute pulmonary edema is a consideration. Other possibilities include extensive bilateral pneumonia, alveolar hemorrhage, ARDS.   Electronically Signed   By: Esperanza Heir M.D.   On: 12/19/2013 15:50   ASSESSMENT / PLAN:  PULMONARY A: Chronic respiratory failure Tracheostomy status  Possible HCAP vs edema P:   Goal SpO2>92 Continuous vent support Defer weaning  CARDIOVASCULAR A:  HTN P:  Hydralazine, Minoxidil  RENAL A:   Azotemia, likely exacerbated by GI hemorrhage CKD P:   Trend BMP No indications for urgent HD HD in general would be consider medically ineffective / futile therapy in this ventilator-dependent patient in persistent vegetative state  GASTROINTESTINAL A:   GERD Constipation PEG status GI Px No evidence thus far bleeding P:   TF per Nutritionist Miralax Protonix  HEMATOLOGIC A:  Acute on chronic anemia, FOBT neg Coagulopathy, corrected with Vit K P:  Trend CBC Avoid anticoagulation SCD  INFECTIOUS A:   Suspected HCAP P:   Abx / cx as above  ENDOCRINE A:   Diabetes Hypoglycemia Hyperglycemia P:   SSI, sensitive scale  Avoid Lantus  NEUROLOGIC A:   S/p intracranial hemorrhage Persistent vegetative state Seizures P:   Keppra  I have personally obtained history, examined patient, evaluated and interpreted laboratory and imaging results, reviewed medical records, formulated assessment / plan and placed orders.  Lonia Farber, MD Pulmonary and Critical Care Medicine [facility] Pager: [phone]  12/21/2013, 11:31 AM

## 2013-12-21 NOTE — Progress Notes (Signed)
ANTIBIOTIC CONSULT NOTE - FOLLOW UP  Pharmacy Consult for vancomycin Indication: rule out pneumonia  Allergies  Allergen Reactions  . Calcium Channel Blockers Other (See Comments)    unkown  . Haldol [Haloperidol Lactate] Other (See Comments)    unknown  . Hctz [Hydrochlorothiazide] Other (See Comments)    unknown  . Lopressor [Metoprolol Tartrate] Other (See Comments)    unknown    Patient Measurements: Height: 5\' 2"  (157.5 cm) Weight: 199 lb 4.7 oz (90.4 kg) IBW/kg (Calculated) : 50.1  Vital Signs: Temp: 98.4 F (36.9 C) (07/23 1136) Temp src: Oral (07/23 1136) BP: 117/55 mmHg (07/23 1203) Pulse Rate: 82 (07/23 1203) Intake/Output from previous day: 07/22 0701 - 07/23 0700 In: 785 [I.V.:330; NG/GT:405; IV Piggyback:50] Out: 1130 [Urine:1130] Intake/Output from this shift: Total I/O In: -  Out: 400 [Urine:400]  Labs:  Recent Labs  12/19/13 1710 12/19/13 2310 12/20/13 0500 12/21/13 0500 12/21/13 1115  WBC 12.3* 12.4* 11.6* 11.7*  --   HGB 6.3* 7.6* 7.5* 7.7*  --   PLT 184 210 225 250  --   CREATININE 1.26*  --  1.51*  --  1.41*   Estimated Creatinine Clearance: 45.5 ml/min (by C-G formula based on Cr of 1.41).  Recent Labs  12/21/13 0500  VANCORANDOM 30.5     Microbiology: Recent Results (from the past 720 hour(s))  URINE CULTURE     Status: None   Collection Time    12/19/13  3:43 PM      Result Value Ref Range Status   Specimen Description URINE, CATHETERIZED   Final   Special Requests NONE   Final   Culture  Setup Time     Final   Value: 12/19/2013 17:50     Performed at [facility] Count     Final   Value: >=100,000 COLONIES/ML     Performed at [facility]   Culture     Final   Value: Multiple bacterial morphotypes present, none predominant. Suggest appropriate recollection if clinically indicated.     Performed at [facility]   Report Status 12/20/2013 FINAL   Final  CULTURE, RESPIRATORY  (NON-EXPECTORATED)     Status: None   Collection Time    12/19/13  3:44 PM      Result Value Ref Range Status   Specimen Description TRACHEAL ASPIRATE   Final   Special Requests NONE   Final   Gram Stain     Final   Value: RARE WBC PRESENT, PREDOMINANTLY MONONUCLEAR     RARE SQUAMOUS EPITHELIAL CELLS PRESENT     FEW GRAM NEGATIVE RODS     RARE GRAM POSITIVE RODS     RARE GRAM POSITIVE COCCI IN PAIRS   Culture     Final   Value: Non-Pathogenic Oropharyngeal-type Flora Isolated.     Performed at [facility]   Report Status PENDING   Incomplete  MRSA PCR SCREENING     Status: None   Collection Time    12/19/13  3:50 PM      Result Value Ref Range Status   MRSA by PCR NEGATIVE  NEGATIVE Final   Comment:            The GeneXpert MRSA Assay (FDA     approved for NASAL specimens     only), is one component of a     comprehensive MRSA colonization     surveillance program. It is not     intended  to diagnose MRSA     infection nor to guide or     monitor treatment for     MRSA infections.  CULTURE, BLOOD (ROUTINE X 2)     Status: None   Collection Time    12/19/13  5:10 PM      Result Value Ref Range Status   Specimen Description BLOOD LEFT HAND   Final   Special Requests BOTTLES DRAWN AEROBIC ONLY 3.5CC   Final   Culture  Setup Time     Final   Value: 12/19/2013 22:19     [facility]formed at [facility]   Culture     Final   Value:        BLOOD CULTURE RECEIVED NO GROWTH TO DATE CULTURE WILL BE HELD FOR 5 DAYS BEFORE ISSUING A FINAL NEGATIVE REPORT     Performed at [facility]   Report Status PENDING   Incomplete  CULTURE, BLOOD (ROUTINE X 2)     Status: None   Collection Time    12/19/13  5:15 PM      Result Value Ref Range Status   Specimen Description BLOOD ARM LEFT   Final   Special Requests BOTTLES DRAWN AEROBIC ONLY 6CC   Final   Culture  Setup Time     Final   Value: 12/19/2013 22:20     Performed at [facility]   Culture     Final    Value:        BLOOD CULTURE RECEIVED NO GROWTH TO DATE CULTURE WILL BE HELD FOR 5 DAYS BEFORE ISSUING A FINAL NEGATIVE REPORT     Performed at [facility]   Report Status PENDING   Incomplete    Anti-infectives   Start     Dose/Rate Route Frequency Ordered Stop   12/20/13 0900  ceFEPIme (MAXIPIME) 1 g in dextrose 5 % 50 mL IVPB     1 g 100 mL/hr over 30 Minutes Intravenous Every 24 hours 12/19/13 1625     12/20/13 0700  vancomycin (VANCOCIN) 1,250 mg in sodium chloride 0.9 % 250 mL IVPB  Status:  Discontinued     1,250 mg 166.7 mL/hr over 90 Minutes Intravenous Every 24 hours 12/19/13 1625 12/20/13 1149      Assessment: 57 yof admitted from [facility] with anemia and renal failure. She continues on D#4 of vancomycin and cefepime which were started at [facility]. Renal function had declined so a random vancomycin level was checked this AM. It was reported as high at 30.5. Scr is down slightly to 1.41. PCT is also trending down. Pt is afebrile but WBC is slightly elevated at 11.7.   Cefepime 7/20 >> Vanc 7/20 >>  7/21 Respiratory >>NPOF 7/21 Urine >>multiple, none predom 7/21 Blood >>NGTD  7/23 VR = 30.5  Goal of Therapy:  Vancomycin trough level 15-20 mcg/ml  Plan:  1. Continue to hold vancomycin - check another random vancomycin level to determine redosing needs and trend Scr 2. F/u renal fxn, C&S, clinical status and [facility]  Sterlin Knightly, Drake Leachachel Lynn 12/21/2013,12:35 PM

## 2013-12-21 NOTE — Progress Notes (Signed)
Report called to [facility]. [facility] to transport.

## 2013-12-21 NOTE — Progress Notes (Signed)
Clinical Social Work Department BRIEF PSYCHOSOCIAL ASSESSMENT 12/21/2013  Patient:  [name],[name]     Account Number:  [id number]     Admit date:  12/19/2013  Clinical Social Worker:  [name],[name], LCSWA  Date/Time:  12/21/2013 03:41 PM  Referred by:  Physician  Date Referred:  12/21/2013 Referred for  SNF Placement   Other Referral:   Interview type:  Other - See comment Other interview type:   CSW unable to assess pt or reach daughters.    CSW completed assessment via chart review, and speaking with Bonnie PersonKathryn A Whiteheart, NP.    PSYCHOSOCIAL DATA Living Status:  FACILITY Admitted from facility:   Level of care:  Skilled Nursing Facility Primary support name:  [name] [phone] Primary support relationship to patient:  CHILD, ADULT Degree of support available:   Pt has 2 daughters however pt needs extensive support and therefore requires placement in a SNF.    CURRENT CONCERNS Current Concerns  Post-Acute Placement   Other Concerns:    SOCIAL WORK ASSESSMENT / PLAN Covering CSW informed that pt is ready for dc back to [facility].    CSW visited pt room however RN informed CSW that pt daughter spent the night but has left the room. CSW unable to assess pt and CSW unable to reach either [name].    CSW informed by Bonnie PersonKathryn A Whiteheart, NP that [facility], family aware that plan was for pt to return back (whether today, unsure.) CSW unable to reach daughters however CSW also reviewed palliative care note and confirmed that daughters continue to pursue aggressvive treatment. Pt is currently medically ready for dc back to [facility].    CSW has left messages for both daughters and await confirmation to proceed with dc.   Assessment/plan status:  Psychosocial Support/Ongoing Assessment of Needs Other assessment/ plan:   Information/referral to community resources:   No resources needed at this time.    PATIENT'S/FAMILY'S RESPONSE TO PLAN OF  CARE: CSW unable to confirm dc plans quite yet with daughter. Discharging CSW to speak with daughter prior to proceeding with dc.       Bonnie BoughNorma Frenchie [name], MSW, LCSW [phone]

## 2013-12-21 NOTE — Progress Notes (Signed)
Patient ZO:XWRUE:[name]      DOB: 12/14/1955      AVW:[id number]   [facility]    Subjective: Unchanged exam. Spoke with daughter [name] this morning as well. She stayed overnight and hopes to speak with CCM team.    Filed Vitals:   12/21/13 1545  BP: 124/55  Pulse: 94  Temp:   Resp: 24   Physical exam: GEN: copiouis secretions, NAD CV: RRR LUNGS: CTAB, symm exp ABD: soft, ND EXT: no c/c/e SKIN: warm/dru NEURO: exam unchanged.      Assessment and plan: 1. Code Status: Full  2. Goals of Care:  See initial consultation note. No source of bleeding with negative FBT.  Hgb stable with Vitamin K. ? Cause. Infection, lovenox in setting of AKI?  3. Symptom Management:  1. Secretions- restart scop patch  4. Dispo- plan for d/c back to [facility].   Orvis BrillAaron J. Pricila Bridge D.O. Palliative Medicine Team [facility]  Pager: [phone] Team Phone: [phone]

## 2013-12-22 LAB — CULTURE, RESPIRATORY W GRAM STAIN

## 2013-12-22 LAB — CULTURE, RESPIRATORY

## 2013-12-22 NOTE — Discharge Summary (Signed)
Buffy Ehler, MD Pulmonary and Critical Care Medicine [facility] Pager: [phone]  

## 2013-12-25 LAB — CULTURE, BLOOD (ROUTINE X 2)
CULTURE: NO GROWTH
Culture: NO GROWTH

## 2013-12-28 ENCOUNTER — Inpatient Hospital Stay (HOSPITAL_COMMUNITY)
Admission: EM | Admit: 2013-12-28 | Discharge: 2014-01-30 | DRG: 377 | Disposition: E | Payer: Medicare Other | Attending: Pulmonary Disease | Admitting: Pulmonary Disease

## 2013-12-28 ENCOUNTER — Encounter (HOSPITAL_COMMUNITY): Payer: Self-pay | Admitting: Emergency Medicine

## 2013-12-28 ENCOUNTER — Emergency Department (HOSPITAL_COMMUNITY): Payer: Medicare Other

## 2013-12-28 DIAGNOSIS — R6521 Severe sepsis with septic shock: Secondary | ICD-10-CM

## 2013-12-28 DIAGNOSIS — I129 Hypertensive chronic kidney disease with stage 1 through stage 4 chronic kidney disease, or unspecified chronic kidney disease: Secondary | ICD-10-CM | POA: Diagnosis not present

## 2013-12-28 DIAGNOSIS — E875 Hyperkalemia: Secondary | ICD-10-CM | POA: Diagnosis present

## 2013-12-28 DIAGNOSIS — R7989 Other specified abnormal findings of blood chemistry: Secondary | ICD-10-CM

## 2013-12-28 DIAGNOSIS — Z888 Allergy status to other drugs, medicaments and biological substances status: Secondary | ICD-10-CM

## 2013-12-28 DIAGNOSIS — N182 Chronic kidney disease, stage 2 (mild): Secondary | ICD-10-CM | POA: Diagnosis not present

## 2013-12-28 DIAGNOSIS — E1169 Type 2 diabetes mellitus with other specified complication: Secondary | ICD-10-CM | POA: Diagnosis not present

## 2013-12-28 DIAGNOSIS — K921 Melena: Principal | ICD-10-CM

## 2013-12-28 DIAGNOSIS — R319 Hematuria, unspecified: Secondary | ICD-10-CM | POA: Diagnosis present

## 2013-12-28 DIAGNOSIS — J189 Pneumonia, unspecified organism: Secondary | ICD-10-CM

## 2013-12-28 DIAGNOSIS — N179 Acute kidney failure, unspecified: Secondary | ICD-10-CM

## 2013-12-28 DIAGNOSIS — Z931 Gastrostomy status: Secondary | ICD-10-CM

## 2013-12-28 DIAGNOSIS — E119 Type 2 diabetes mellitus without complications: Secondary | ICD-10-CM

## 2013-12-28 DIAGNOSIS — J9611 Chronic respiratory failure with hypoxia: Secondary | ICD-10-CM

## 2013-12-28 DIAGNOSIS — R5381 Other malaise: Secondary | ICD-10-CM | POA: Diagnosis present

## 2013-12-28 DIAGNOSIS — K219 Gastro-esophageal reflux disease without esophagitis: Secondary | ICD-10-CM | POA: Diagnosis present

## 2013-12-28 DIAGNOSIS — Z66 Do not resuscitate: Secondary | ICD-10-CM | POA: Diagnosis not present

## 2013-12-28 DIAGNOSIS — K5900 Constipation, unspecified: Secondary | ICD-10-CM | POA: Diagnosis present

## 2013-12-28 DIAGNOSIS — F17200 Nicotine dependence, unspecified, uncomplicated: Secondary | ICD-10-CM | POA: Diagnosis present

## 2013-12-28 DIAGNOSIS — R1310 Dysphagia, unspecified: Secondary | ICD-10-CM | POA: Diagnosis not present

## 2013-12-28 DIAGNOSIS — E872 Acidosis, unspecified: Secondary | ICD-10-CM | POA: Diagnosis present

## 2013-12-28 DIAGNOSIS — J9620 Acute and chronic respiratory failure, unspecified whether with hypoxia or hypercapnia: Secondary | ICD-10-CM

## 2013-12-28 DIAGNOSIS — N170 Acute kidney failure with tubular necrosis: Secondary | ICD-10-CM | POA: Diagnosis present

## 2013-12-28 DIAGNOSIS — R34 Anuria and oliguria: Secondary | ICD-10-CM | POA: Diagnosis not present

## 2013-12-28 DIAGNOSIS — J80 Acute respiratory distress syndrome: Secondary | ICD-10-CM

## 2013-12-28 DIAGNOSIS — D62 Acute posthemorrhagic anemia: Secondary | ICD-10-CM | POA: Diagnosis not present

## 2013-12-28 DIAGNOSIS — L8992 Pressure ulcer of unspecified site, stage 2: Secondary | ICD-10-CM | POA: Diagnosis not present

## 2013-12-28 DIAGNOSIS — Z794 Long term (current) use of insulin: Secondary | ICD-10-CM

## 2013-12-28 DIAGNOSIS — J151 Pneumonia due to Pseudomonas: Secondary | ICD-10-CM | POA: Diagnosis not present

## 2013-12-28 DIAGNOSIS — R0902 Hypoxemia: Secondary | ICD-10-CM

## 2013-12-28 DIAGNOSIS — K922 Gastrointestinal hemorrhage, unspecified: Secondary | ICD-10-CM | POA: Diagnosis present

## 2013-12-28 DIAGNOSIS — Z9911 Dependence on respirator [ventilator] status: Secondary | ICD-10-CM | POA: Diagnosis not present

## 2013-12-28 DIAGNOSIS — A419 Sepsis, unspecified organism: Secondary | ICD-10-CM | POA: Diagnosis not present

## 2013-12-28 DIAGNOSIS — E876 Hypokalemia: Secondary | ICD-10-CM | POA: Diagnosis not present

## 2013-12-28 DIAGNOSIS — I69991 Dysphagia following unspecified cerebrovascular disease: Secondary | ICD-10-CM | POA: Diagnosis not present

## 2013-12-28 DIAGNOSIS — R6520 Severe sepsis without septic shock: Secondary | ICD-10-CM | POA: Diagnosis not present

## 2013-12-28 DIAGNOSIS — R403 Persistent vegetative state: Secondary | ICD-10-CM

## 2013-12-28 DIAGNOSIS — L89109 Pressure ulcer of unspecified part of back, unspecified stage: Secondary | ICD-10-CM | POA: Diagnosis present

## 2013-12-28 DIAGNOSIS — J984 Other disorders of lung: Secondary | ICD-10-CM | POA: Diagnosis not present

## 2013-12-28 DIAGNOSIS — I498 Other specified cardiac arrhythmias: Secondary | ICD-10-CM | POA: Diagnosis not present

## 2013-12-28 DIAGNOSIS — R569 Unspecified convulsions: Secondary | ICD-10-CM | POA: Diagnosis present

## 2013-12-28 DIAGNOSIS — N189 Chronic kidney disease, unspecified: Secondary | ICD-10-CM

## 2013-12-28 DIAGNOSIS — Z515 Encounter for palliative care: Secondary | ICD-10-CM

## 2013-12-28 DIAGNOSIS — J9610 Chronic respiratory failure, unspecified whether with hypoxia or hypercapnia: Secondary | ICD-10-CM

## 2013-12-28 DIAGNOSIS — Z79899 Other long term (current) drug therapy: Secondary | ICD-10-CM

## 2013-12-28 DIAGNOSIS — I469 Cardiac arrest, cause unspecified: Secondary | ICD-10-CM

## 2013-12-28 DIAGNOSIS — D649 Anemia, unspecified: Secondary | ICD-10-CM

## 2013-12-28 DIAGNOSIS — Z930 Tracheostomy status: Secondary | ICD-10-CM | POA: Diagnosis not present

## 2013-12-28 DIAGNOSIS — K117 Disturbances of salivary secretion: Secondary | ICD-10-CM

## 2013-12-28 HISTORY — DX: Tobacco use: Z72.0

## 2013-12-28 LAB — CBC WITH DIFFERENTIAL/PLATELET
Basophils Absolute: 0 10*3/uL (ref 0.0–0.1)
Basophils Relative: 0 % (ref 0–1)
EOS PCT: 1 % (ref 0–5)
Eosinophils Absolute: 0.1 10*3/uL (ref 0.0–0.7)
HEMATOCRIT: 14.3 % — AB (ref 36.0–46.0)
HEMOGLOBIN: 4.6 g/dL — AB (ref 12.0–15.0)
LYMPHS PCT: 6 % — AB (ref 12–46)
Lymphs Abs: 0.9 10*3/uL (ref 0.7–4.0)
MCH: 29.1 pg (ref 26.0–34.0)
MCHC: 32.2 g/dL (ref 30.0–36.0)
MCV: 90.5 fL (ref 78.0–100.0)
MONOS PCT: 3 % (ref 3–12)
Monocytes Absolute: 0.4 10*3/uL (ref 0.1–1.0)
Neutro Abs: 12.6 10*3/uL — ABNORMAL HIGH (ref 1.7–7.7)
Neutrophils Relative %: 90 % — ABNORMAL HIGH (ref 43–77)
PLATELETS: 245 10*3/uL (ref 150–400)
RBC: 1.58 MIL/uL — AB (ref 3.87–5.11)
RDW: 18.6 % — ABNORMAL HIGH (ref 11.5–15.5)
WBC: 14 10*3/uL — ABNORMAL HIGH (ref 4.0–10.5)

## 2013-12-28 LAB — I-STAT CHEM 8, ED
BUN: >140 mg/dL — ABNORMAL HIGH (ref 6–23)
Calcium, Ion: 1.05 mmol/L — ABNORMAL LOW (ref 1.12–1.23)
Chloride: 105 mEq/L (ref 96–112)
Creatinine, Ser: 4.8 mg/dL — ABNORMAL HIGH (ref 0.50–1.10)
Glucose, Bld: 325 mg/dL — ABNORMAL HIGH (ref 70–99)
HEMATOCRIT: 17 % — AB (ref 36.0–46.0)
Hemoglobin: 5.8 g/dL — CL (ref 12.0–15.0)
POTASSIUM: 6.3 meq/L — AB (ref 3.7–5.3)
SODIUM: 137 meq/L (ref 137–147)
TCO2: 19 mmol/L (ref 0–100)

## 2013-12-28 LAB — COMPREHENSIVE METABOLIC PANEL
ALT: 42 U/L — ABNORMAL HIGH (ref 0–35)
ANION GAP: 24 — AB (ref 5–15)
AST: 68 U/L — ABNORMAL HIGH (ref 0–37)
Albumin: 2 g/dL — ABNORMAL LOW (ref 3.5–5.2)
Alkaline Phosphatase: 339 U/L — ABNORMAL HIGH (ref 39–117)
BUN: 174 mg/dL — ABNORMAL HIGH (ref 6–23)
CALCIUM: 8.4 mg/dL (ref 8.4–10.5)
CO2: 16 meq/L — AB (ref 19–32)
CREATININE: 3.9 mg/dL — AB (ref 0.50–1.10)
Chloride: 97 mEq/L (ref 96–112)
GFR calc non Af Amer: 12 mL/min — ABNORMAL LOW (ref >90)
GFR, EST AFRICAN AMERICAN: 14 mL/min — AB (ref >90)
GLUCOSE: 321 mg/dL — AB (ref 70–99)
Potassium: 6.5 mEq/L (ref 3.7–5.3)
Sodium: 137 mEq/L (ref 137–147)
TOTAL PROTEIN: 7.9 g/dL (ref 6.0–8.3)
Total Bilirubin: 1.1 mg/dL (ref 0.3–1.2)

## 2013-12-28 LAB — I-STAT ARTERIAL BLOOD GAS, ED
ACID-BASE DEFICIT: 7 mmol/L — AB (ref 0.0–2.0)
Bicarbonate: 19.7 mEq/L — ABNORMAL LOW (ref 20.0–24.0)
O2 SAT: 97 %
PCO2 ART: 42.7 mmHg (ref 35.0–45.0)
Patient temperature: 97.5
TCO2: 21 mmol/L (ref 0–100)
pH, Arterial: 7.27 — ABNORMAL LOW (ref 7.350–7.450)
pO2, Arterial: 107 mmHg — ABNORMAL HIGH (ref 80.0–100.0)

## 2013-12-28 LAB — APTT: APTT: 43 s — AB (ref 24–37)

## 2013-12-28 LAB — PROTIME-INR
INR: 1.66 — ABNORMAL HIGH (ref 0.00–1.49)
Prothrombin Time: 19.6 seconds — ABNORMAL HIGH (ref 11.6–15.2)

## 2013-12-28 LAB — PROCALCITONIN: PROCALCITONIN: 4.25 ng/mL

## 2013-12-28 LAB — POC OCCULT BLOOD, ED: Fecal Occult Bld: POSITIVE — AB

## 2013-12-28 LAB — I-STAT CG4 LACTIC ACID, ED: LACTIC ACID, VENOUS: 1.27 mmol/L (ref 0.5–2.2)

## 2013-12-28 LAB — PREPARE RBC (CROSSMATCH)

## 2013-12-28 MED ORDER — SODIUM CHLORIDE 0.9 % IV SOLN
Freq: Once | INTRAVENOUS | Status: AC
  Administered 2013-12-28: 18:00:00 via INTRAVENOUS

## 2013-12-28 MED ORDER — SODIUM CHLORIDE 0.9 % IJ SOLN
10.0000 mL | INTRAMUSCULAR | Status: DC | PRN
  Administered 2014-01-07: 10 mL

## 2013-12-28 MED ORDER — SODIUM CHLORIDE 0.9 % IV SOLN
INTRAVENOUS | Status: DC
  Administered 2013-12-28: 20:00:00 via INTRAVENOUS
  Administered 2013-12-29: 1 mL via INTRAVENOUS

## 2013-12-28 MED ORDER — CHLORHEXIDINE GLUCONATE 0.12 % MT SOLN
15.0000 mL | Freq: Two times a day (BID) | OROMUCOSAL | Status: DC
  Administered 2013-12-28 – 2014-01-07 (×21): 15 mL via OROMUCOSAL
  Filled 2013-12-28 (×20): qty 15

## 2013-12-28 MED ORDER — SODIUM CHLORIDE 0.9 % IV SOLN: 1000.0000 mL | Freq: Once | INTRAVENOUS | Status: DC

## 2013-12-28 MED ORDER — SODIUM CHLORIDE 0.9 % IV SOLN
250.0000 mL | INTRAVENOUS | Status: DC | PRN
  Administered 2014-01-07: 17:00:00 via INTRAVENOUS
  Administered 2014-01-07: 250 mL via INTRAVENOUS

## 2013-12-28 MED ORDER — SODIUM CHLORIDE 0.9 % IV SOLN
1.0000 g | Freq: Once | INTRAVENOUS | Status: AC
  Administered 2013-12-28: 1 g via INTRAVENOUS
  Filled 2013-12-28: qty 10

## 2013-12-28 MED ORDER — INSULIN ASPART 100 UNIT/ML ~~LOC~~ SOLN
0.0000 [IU] | SUBCUTANEOUS | Status: DC
  Administered 2013-12-29: 4 [IU] via SUBCUTANEOUS
  Administered 2013-12-30 (×2): 3 [IU] via SUBCUTANEOUS
  Administered 2013-12-30: 4 [IU] via SUBCUTANEOUS
  Administered 2013-12-30: 3 [IU] via SUBCUTANEOUS
  Administered 2013-12-30 – 2013-12-31 (×2): 4 [IU] via SUBCUTANEOUS
  Administered 2013-12-31: 3 [IU] via SUBCUTANEOUS
  Administered 2013-12-31: 4 [IU] via SUBCUTANEOUS
  Administered 2013-12-31: 3 [IU] via SUBCUTANEOUS
  Administered 2013-12-31: 4 [IU] via SUBCUTANEOUS
  Administered 2014-01-01: 3 [IU] via SUBCUTANEOUS
  Administered 2014-01-01: 2 [IU] via SUBCUTANEOUS
  Administered 2014-01-01: 3 [IU] via SUBCUTANEOUS
  Administered 2014-01-02: 4 [IU] via SUBCUTANEOUS
  Administered 2014-01-02: 3 [IU] via SUBCUTANEOUS
  Administered 2014-01-02: 4 [IU] via SUBCUTANEOUS
  Administered 2014-01-02: 3 [IU] via SUBCUTANEOUS
  Administered 2014-01-02: 4 [IU] via SUBCUTANEOUS
  Administered 2014-01-02 – 2014-01-03 (×2): 3 [IU] via SUBCUTANEOUS
  Administered 2014-01-03 – 2014-01-04 (×7): 4 [IU] via SUBCUTANEOUS
  Administered 2014-01-04 – 2014-01-05 (×4): 3 [IU] via SUBCUTANEOUS
  Administered 2014-01-06: 4 [IU] via SUBCUTANEOUS
  Administered 2014-01-06: 3 [IU] via SUBCUTANEOUS
  Administered 2014-01-06 – 2014-01-07 (×4): 4 [IU] via SUBCUTANEOUS
  Administered 2014-01-07 (×2): 3 [IU] via SUBCUTANEOUS
  Administered 2014-01-07: 4 [IU] via SUBCUTANEOUS

## 2013-12-28 MED ORDER — SODIUM POLYSTYRENE SULFONATE 15 GM/60ML PO SUSP
60.0000 g | Freq: Once | ORAL | Status: AC
  Administered 2013-12-28: 60 g
  Filled 2013-12-28: qty 240

## 2013-12-28 MED ORDER — INSULIN ASPART 100 UNIT/ML ~~LOC~~ SOLN
0.0000 [IU] | SUBCUTANEOUS | Status: AC
  Administered 2013-12-28: 11 [IU] via SUBCUTANEOUS
  Administered 2013-12-28: 20 [IU] via SUBCUTANEOUS
  Administered 2013-12-28 – 2013-12-29 (×2): 11 [IU] via SUBCUTANEOUS

## 2013-12-28 MED ORDER — CETYLPYRIDINIUM CHLORIDE 0.05 % MT LIQD
7.0000 mL | Freq: Four times a day (QID) | OROMUCOSAL | Status: DC
  Administered 2013-12-29 – 2014-01-07 (×40): 7 mL via OROMUCOSAL

## 2013-12-28 MED ORDER — PANTOPRAZOLE SODIUM 40 MG IV SOLR
40.0000 mg | Freq: Two times a day (BID) | INTRAVENOUS | Status: DC
  Administered 2014-01-01 – 2014-01-03 (×5): 40 mg via INTRAVENOUS
  Filled 2013-12-28 (×7): qty 40

## 2013-12-28 MED ORDER — INSULIN ASPART 100 UNIT/ML ~~LOC~~ SOLN: 2.0000 [IU] | SUBCUTANEOUS | Status: DC

## 2013-12-28 MED ORDER — SODIUM CHLORIDE 0.9 % IV SOLN
8.0000 mg/h | INTRAVENOUS | Status: AC
  Administered 2013-12-28 – 2013-12-31 (×5): 8 mg/h via INTRAVENOUS
  Filled 2013-12-28 (×14): qty 80

## 2013-12-28 MED ORDER — SODIUM CHLORIDE 0.9 % IV SOLN
80.0000 mg | Freq: Once | INTRAVENOUS | Status: AC
  Administered 2013-12-28: 80 mg via INTRAVENOUS
  Filled 2013-12-28: qty 80

## 2013-12-28 MED ORDER — PIPERACILLIN-TAZOBACTAM 3.375 G IVPB 30 MIN
3.3750 g | Freq: Once | INTRAVENOUS | Status: AC
  Administered 2013-12-28: 3.375 g via INTRAVENOUS
  Filled 2013-12-28: qty 50

## 2013-12-28 MED ORDER — INSULIN ASPART 100 UNIT/ML IV SOLN
10.0000 [IU] | Freq: Once | INTRAVENOUS | Status: AC
  Administered 2013-12-28: 10 [IU] via INTRAVENOUS
  Filled 2013-12-28: qty 0.1

## 2013-12-28 MED ORDER — VANCOMYCIN HCL 10 G IV SOLR: 20.0000 mg/kg | Freq: Once | INTRAVENOUS | Status: DC

## 2013-12-28 MED ORDER — ALBUTEROL SULFATE (2.5 MG/3ML) 0.083% IN NEBU
5.0000 mg | INHALATION_SOLUTION | Freq: Once | RESPIRATORY_TRACT | Status: AC
  Administered 2013-12-28: 5 mg via RESPIRATORY_TRACT
  Filled 2013-12-28: qty 6

## 2013-12-28 MED ORDER — SODIUM CHLORIDE 0.9 % IJ SOLN
10.0000 mL | Freq: Two times a day (BID) | INTRAMUSCULAR | Status: DC
  Administered 2013-12-29 – 2013-12-30 (×3): 10 mL
  Administered 2013-12-31: 20 mL
  Administered 2014-01-01 (×2): 10 mL
  Administered 2014-01-02: 20 mL
  Administered 2014-01-02 – 2014-01-03 (×2): 10 mL
  Administered 2014-01-03: 20 mL
  Administered 2014-01-04 – 2014-01-06 (×6): 10 mL
  Administered 2014-01-07: 20 mL
  Administered 2014-01-07: 10 mL

## 2013-12-28 MED ORDER — SODIUM CHLORIDE 0.9 % IV SOLN: Freq: Once | INTRAVENOUS | Status: DC

## 2013-12-28 MED ORDER — DEXTROSE 50 % IV SOLN
1.0000 | Freq: Once | INTRAVENOUS | Status: AC
  Administered 2013-12-28: 50 mL via INTRAVENOUS
  Filled 2013-12-28: qty 50

## 2013-12-28 MED ORDER — VANCOMYCIN HCL 10 G IV SOLR
2000.0000 mg | Freq: Once | INTRAVENOUS | Status: AC
  Administered 2013-12-28: 2000 mg via INTRAVENOUS
  Filled 2013-12-28: qty 2000

## 2013-12-28 NOTE — ED Notes (Signed)
Pt orally suctioned.

## 2013-12-28 NOTE — ED Notes (Signed)
IV antibiotics paused; attempting to get blood cultures

## 2013-12-28 NOTE — ED Provider Notes (Signed)
CSN: [id number]     Arrival date & time 10/13/13  1702 History   First MD Initiated Contact with Patient 005/15/15 1711     Chief Complaint  Patient presents with  . Abnormal Lab     (Consider location/radiation/quality/duration/timing/severity/associated sxs/prior Treatment) Patient is a 58 y.o. female presenting with general illness. The history is provided by the patient.  Illness Severity:  Severe Onset quality:  Sudden Duration:  2 days Timing:  Constant Progression:  Unchanged Chronicity:  Recurrent Associated symptoms: no fever, no nausea and no vomiting     [age] year female with a chief complaint of anemia. Patient was sent from [facility] to 2 anemia hyperkalemia and elevated BUN.  Possible decreased responsiveness from prior. Patient with a history of intracranial hemorrhage trach and PEG vent dependent currently full code.  Past Medical History  Diagnosis Date  . Chronic respiratory failure   . Tracheostomy status   . Persist vegetative state   . 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 Type II diabetes mellitus   . HTN (hypertension)   . Anemia, unspecified   . CKD (chronic kidney disease) stage 2, GFR 60-89 ml/min   . Corneal abscess     history of   . Gastro-esophageal reflux   . Chronic constipation   . 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 Peg tube placement      unknown insertion date   . Tracheostomy      unknown insertion site   No family history on file. History  Substance Use Topics  . Smoking status: Not on file  . Smokeless tobacco: Not on file  . Alcohol Use: Not on file   OB History   Grav Para Term Preterm Abortions TAB SAB Ect Mult Living                 Review of Systems  Unable to perform ROS: Patient nonverbal  Constitutional: Negative for fever.  Gastrointestinal: Negative for nausea and vomiting.      Allergies  Calcium  channel blockers; Haldol; Hctz; and Lopressor  Home Medications   Prior to Admission medications   Medication Sig Start Date End Date Taking? Authorizing Provider  acetaminophen (TYLENOL) 160 MG/5ML liquid Place 650 mg into feeding tube every 6 (six) hours as needed for fever.   Yes Historical Provider, MD  atropine 1 % ophthalmic solution Place 1 drop into both eyes 3 (three) times daily.   Yes Historical Provider, MD  carboxymethylcellulose (REFRESH PLUS) 0.5 % SOLN Apply 2 drops to eye every 8 (eight) hours as needed (dry irritated eyes).   Yes Historical Provider, MD  chlorhexidine (PERIDEX) 0.12 % solution Use as directed 15 mLs in the mouth or throat 2 (two) times daily. For trach tube   Yes Historical Provider, MD  docusate (COLACE) 50 MG/5ML liquid Place 100 mg into feeding tube daily.   Yes Historical Provider, MD  ferrous sulfate 300 (60 FE) MG/5ML syrup Place 300 mg into feeding tube every 12 (twelve) hours.   Yes Historical Provider, MD  furosemide (LASIX) 10 MG/ML solution Place 40 mg into feeding tube every morning.    Yes Historical Provider, MD  guaiFENesin (ROBITUSSIN) 100 MG/5ML liquid Place 200 mg into feeding tube every 4 (four) hours as needed (thick secretions).   Yes Historical Provider, MD  hydrALAZINE (APRESOLINE) 100 MG tablet Give 50 mg  by tube 3 (three) times daily.    Yes Historical Provider, MD  insulin glargine (LANTUS) 100 UNIT/ML injection Inject 16 Units into the skin every morning.   Yes Historical Provider, MD  insulin regular (NOVOLIN R,HUMULIN R) 100 units/mL injection Inject 0-5 Units into the skin 3 (three) times daily before meals. 0-150= 0 units 151-200= 1 unit 201-250= 2 units 251-300= 3 units 301-350= 4 units 351-400=5 units >400 call physician   Yes Historical Provider, MD  lansoprazole (PREVACID SOLUTAB) 30 MG disintegrating tablet Place 30 mg into feeding tube every morning.    Yes Historical Provider, MD  levETIRAcetam (KEPPRA) 100 MG/ML  solution Place 500 mg into feeding tube every 12 (twelve) hours.   Yes Historical Provider, MD  Lidocaine 0.5 % AERO Take 2 mLs by nebulization every 4 (four) hours as needed (cough related to Chronic Respiratory Failure).   Yes Historical Provider, MD  minoxidil (LONITEN) 2.5 MG tablet Place 2.5 mg into feeding tube daily.   Yes Historical Provider, MD  Nutritional Supplements (FEEDING SUPPLEMENT, VITAL HIGH PROTEIN,) LIQD liquid 70ml/hr 12/21/13  Yes Bernadene Person, NP  polyethylene glycol (MIRALAX / GLYCOLAX) packet Place 17 g into feeding tube every 12 (twelve) hours as needed for moderate constipation.    Yes Historical Provider, MD  scopolamine (TRANSDERM-SCOP) 1 MG/3DAYS Place 1 patch onto the skin every 3 (three) days.   Yes Historical Provider, MD  sennosides (SENOKOT) 8.8 MG/5ML syrup Place 10 mLs into feeding tube every morning.    Yes Historical Provider, MD  ceFEPIme 1 g in dextrose 5 % 50 mL Inject 1 g into the vein daily. 12/21/13   Bernadene Person, NP  vancomycin 1,000 mg in sodium chloride 0.9 % 250 mL Inject 1,000 mg into the vein every 12 (twelve) hours.    Historical Provider, MD   BP 141/60  Pulse 73  Temp(Src) 95.5 F (35.3 C) (Temporal)  Resp 30  Ht 5' 1.81" (1.57 m)  Wt 199 lb 4.7 oz (90.4 kg)  BMI 36.67 kg/m2  SpO2 96% Physical Exam  Constitutional: She appears well-developed. She appears ill.  HENT:  Head: Normocephalic and atraumatic.  Right Ear: External ear normal.  Left Ear: External ear normal.  Eyes: Pupils are equal, round, and reactive to light. Right eye exhibits no discharge.  Neck: Neck supple. No JVD present. No tracheal deviation present.  Cardiovascular: Normal rate, regular rhythm and normal heart sounds.  Exam reveals no friction rub.   No murmur heard. Pulmonary/Chest: Breath sounds normal. No stridor. No respiratory distress. She has no wheezes.  Abdominal: She exhibits distension. There is no tenderness. There is no rebound and no  guarding.  Genitourinary: Vagina normal.  Musculoskeletal:  Contractures all 4 extremities  Skin: No rash noted. She is diaphoretic.  Stage II pressure ulcer to sacrum    ED Course  Procedures (including critical care time) Labs Review Labs Reviewed  APTT - Abnormal; Notable for the following:    aPTT 43 (*)    All other components within normal limits  PROTIME-INR - Abnormal; Notable for the following:    Prothrombin Time 19.6 (*)    INR 1.66 (*)    All other components within normal limits  CBC WITH DIFFERENTIAL - Abnormal; Notable for the following:    WBC 14.0 (*)    RBC 1.58 (*)    Hemoglobin 4.6 (*)    HCT 14.3 (*)    RDW 18.6 (*)    Neutrophils Relative % 90 (*)  Neutro Abs 12.6 (*)    Lymphocytes Relative 6 (*)    All other components within normal limits  COMPREHENSIVE METABOLIC PANEL - Abnormal; Notable for the following:    Potassium 6.5 (*)    CO2 16 (*)    Glucose, Bld 321 (*)    BUN 174 (*)    Creatinine, Ser 3.90 (*)    Albumin 2.0 (*)    AST 68 (*)    ALT 42 (*)    Alkaline Phosphatase 339 (*)    GFR calc non Af Amer 12 (*)    GFR calc Af Amer 14 (*)    Anion gap 24 (*)    All other components within normal limits  I-STAT CHEM 8, ED - Abnormal; Notable for the following:    Potassium 6.3 (*)    BUN >140 (*)    Creatinine, Ser 4.80 (*)    Glucose, Bld 325 (*)    Calcium, Ion 1.05 (*)    Hemoglobin 5.8 (*)    HCT 17.0 (*)    All other components within normal limits  POC OCCULT BLOOD, ED - Abnormal; Notable for the following:    Fecal Occult Bld POSITIVE (*)    All other components within normal limits  I-STAT ARTERIAL BLOOD GAS, ED - Abnormal; Notable for the following:    pH, Arterial 7.270 (*)    pO2, Arterial 107.0 (*)    Bicarbonate 19.7 (*)    Acid-base deficit 7.0 (*)    All other components within normal limits  CULTURE, RESPIRATORY (NON-EXPECTORATED)  CULTURE, BLOOD (ROUTINE X 2)  CULTURE, BLOOD (ROUTINE X 2)  CBC  CBC  CBC   CBC  BASIC METABOLIC PANEL  PROCALCITONIN  PROCALCITONIN  LACTIC ACID, PLASMA  I-STAT CG4 LACTIC ACID, ED  TYPE AND SCREEN  PREPARE RBC (CROSSMATCH)  PREPARE RBC (CROSSMATCH)    Imaging Review Dg Chest Portable 1 View  12/15/2013   CLINICAL DATA:  Verify PICC placement.  EXAM: PORTABLE CHEST - 1 VIEW  COMPARISON:  Chest radiograph December 20, 2013  FINDINGS: Right PICC tip projects in proximal superior vena cava both, no apparent change in position. Tracheostomy tube tip projects 5 cm above the carina.  Cardiomediastinal silhouette is nonsuspicious. Diffuse interstitial and to lesser extent alveolar airspace opacities are decreased. Mildly elevated right hemidiaphragm. No pleural effusions. No pneumothorax though, lung apices incompletely imaged. Soft tissue planes and included osseous structures are unchanged.  IMPRESSION: Right PICC distal tip projects in proximal superior vena cava, no apparent change in position. Tracheostomy tube tip projects 5 cm above the carina.  Decreased diffuse interstitial and alveolar airspace opacities.   Electronically Signed   By: Awilda Metro   On: 12/21/2013 17:48     EKG Interpretation   Date/Time:  Thursday December 28 2013 17:25:23 EDT Ventricular Rate:  92 PR Interval:  152 QRS Duration: 85 QT Interval:  333 QTC Calculation: 412 R Axis:   67 Text Interpretation:  Sinus rhythm Low voltage, precordial leads No  previous ECGs available Confirmed by YAO  MD, DAVID ([id number]) on 12/04/2013  5:32:56 PM      MDM   Final diagnoses:  HCAP (healthcare-associated pneumonia)  Gastrointestinal hemorrhage with melena  Renal failure (ARF), acute on chronic    58 year old female with chronic debilitation secondary to intracranial hemorrhage. Patient with acute lower GI bleed, hyperkalemia and acute kidney injury. We'll give the patient 2 units of blood. Patient given calcium. ABG with metabolic acidosis, concern for lactic acidosis.  Patient without peak T  waves on EKG.  Lactate normal.  Patient given vanc and Zosyn for presumptive hospital-acquired pneumonia. Repeat hemoglobin here 4.6.  Patient transiently hypoxic.   Admit to critical care.    Melene Plan, MD Jan 14, 2014 2127

## 2013-12-28 NOTE — ED Notes (Signed)
CCM at bedside 

## 2013-12-28 NOTE — ED Notes (Signed)
Dr.Floyd aware of facial twitching and drooling

## 2013-12-28 NOTE — ED Notes (Signed)
Hemoglobin of 4.6 reported to RES and RN

## 2013-12-28 NOTE — H&P (Signed)
PULMONARY / CRITICAL CARE MEDICINE   Name: [name] MRN: [id number] DOB: Jul 17, 1955    ADMISSION DATE:  01/20/14 CONSULTATION DATE:  Jan 20, 2014  REFERRING MD :  [facility] >> EDP  CHIEF COMPLAINT:  Acute anemia, GIB  INITIAL PRESENTATION: 58 yo trach dependent female with recent ICH 01/2013 complicated by respiratory failure. 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 She was transfered from [facility] with anemia requiring transfusion.  STUDIES:  7/30 - FOBT (Positive)  SIGNIFICANT EVENTS: 01/2014 - ICH > resulting in chronic resp failure with trach status.  7/21 > 7/23 - Admission for anemia requiring transfusion 7/30 - admitted for anemia Hgb 4.6, transfuse 3u PRBC, consulted GI, HyperK to 6.5, worsening AKI  HISTORY OF PRESENT ILLNESS:  58 y.o. [name] dependent female with PMH as below, which includes chronic respiratory failure, intracerebral hemorrhage, persistent vegetative state, DM, Htn and GERD who presented 7/30 from [facility] with anemia despite blood transfusion. She has recent admission for similar episode during which she was transfused. Although there was suspicion for GIB, occult blood testing was negative. Acute cause of bleeding was not found during that time. Presents from [facility] again 7/30 with c/o anemia. In ED Notably there was a palliative care consultation during that admission. Family wishes to pursue aggressive care.   PAST MEDICAL HISTORY :  Past Medical History  Diagnosis Date  . Chronic respiratory failure   . Tracheostomy status   . Persist vegetative state   . 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 Type II diabetes mellitus   . HTN (hypertension)   . Anemia, unspecified   . CKD (chronic kidney disease) stage 2, GFR 60-89 ml/min   . Corneal abscess     history of   . Gastro-esophageal reflux   . Chronic constipation   . 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 Peg tube placement      unknown insertion date   . Tracheostomy      unknown insertion site   Prior to Admission medications   Medication Sig Start Date End Date Taking? Authorizing Provider  acetaminophen (TYLENOL) 160 MG/5ML liquid Place 650 mg into feeding tube every 6 (six) hours as needed for fever.   Yes Historical Provider, MD  atropine 1 % ophthalmic solution Place 1 drop into both eyes 3 (three) times daily.   Yes Historical Provider, MD  carboxymethylcellulose (REFRESH PLUS) 0.5 % SOLN Apply 2 drops to eye every 8 (eight) hours as needed (dry irritated eyes).   Yes Historical Provider, MD  chlorhexidine (PERIDEX) 0.12 % solution Use as directed 15 mLs in the mouth or throat 2 (two) times daily. For trach tube   Yes Historical Provider, MD  docusate (COLACE) 50 MG/5ML liquid Place 100 mg into feeding tube daily.   Yes Historical Provider, MD  ferrous sulfate 300 (60 FE) MG/5ML syrup Place 300 mg into feeding tube every 12 (twelve) hours.   Yes Historical Provider, MD  furosemide (LASIX) 10 MG/ML solution Place 40 mg into feeding tube every morning.    Yes Historical Provider, MD  guaiFENesin (ROBITUSSIN) 100 MG/5ML liquid Place 200 mg into feeding tube every 4 (four) hours as needed (thick secretions).   Yes Historical Provider, MD  hydrALAZINE (APRESOLINE) 100 MG tablet Give 50 mg by tube 3 (three) times daily.    Yes Historical Provider, MD  insulin  glargine (LANTUS) 100 UNIT/ML injection Inject 16 Units into the skin every morning.   Yes Historical Provider, MD  insulin regular (NOVOLIN R,HUMULIN R) 100 units/mL injection Inject 0-5 Units into the skin 3 (three) times daily before meals. 0-150= 0 units 151-200= 1 unit 201-250= 2 units 251-300= 3 units 301-350= 4 units 351-400=5 units >400 call physician   Yes Historical Provider, MD  lansoprazole (PREVACID SOLUTAB) 30 MG disintegrating tablet Place 30 mg into feeding tube every morning.    Yes  Historical Provider, MD  levETIRAcetam (KEPPRA) 100 MG/ML solution Place 500 mg into feeding tube every 12 (twelve) hours.   Yes Historical Provider, MD  Lidocaine 0.5 % AERO Take 2 mLs by nebulization every 4 (four) hours as needed (cough related to Chronic Respiratory Failure).   Yes Historical Provider, MD  minoxidil (LONITEN) 2.5 MG tablet Place 2.5 mg into feeding tube daily.   Yes Historical Provider, MD  Nutritional Supplements (FEEDING SUPPLEMENT, VITAL HIGH PROTEIN,) LIQD liquid 36ml/hr 12/21/13  Yes Bernadene Person, NP  polyethylene glycol (MIRALAX / GLYCOLAX) packet Place 17 g into feeding tube every 12 (twelve) hours as needed for moderate constipation.    Yes Historical Provider, MD  scopolamine (TRANSDERM-SCOP) 1 MG/3DAYS Place 1 patch onto the skin every 3 (three) days.   Yes Historical Provider, MD  sennosides (SENOKOT) 8.8 MG/5ML syrup Place 10 mLs into feeding tube every morning.    Yes Historical Provider, MD  ceFEPIme 1 g in dextrose 5 % 50 mL Inject 1 g into the vein daily. 12/21/13   Bernadene Person, NP  vancomycin 1,000 mg in sodium chloride 0.9 % 250 mL Inject 1,000 mg into the vein every 12 (twelve) hours.    Historical Provider, MD   Allergies  Allergen Reactions  . Calcium Channel Blockers Other (See Comments)    unkown  . Haldol [Haloperidol Lactate] Other (See Comments)    unknown  . Hctz [Hydrochlorothiazide] Other (See Comments)    unknown  . Lopressor [Metoprolol Tartrate] Other (See Comments)    unknown    FAMILY HISTORY:  No family history on file. SOCIAL HISTORY:  has no tobacco, alcohol, and drug history on file.  REVIEW OF SYSTEMS:  Unable to obtain  SUBJECTIVE:   VITAL SIGNS: Temp:  [96.5 F (35.8 C)-97.5 F (36.4 C)] 96.5 F (35.8 C) (07/30 1900) Pulse Rate:  [81-96] 90 (07/30 1900) Resp:  [26-95] 26 (07/30 1900) BP: (108-132)/(26-44) 118/42 mmHg (07/30 1900) SpO2:  [61 %-98 %] 98 % (07/30 1900) FiO2 (%):  [50 %-80 %] 70 % (07/30  1843) HEMODYNAMICS:   VENTILATOR SETTINGS: Vent Mode:  [-] PRVC FiO2 (%):  [50 %-80 %] 70 % Set Rate:  [16 bmp] 16 bmp Vt Set:  [4450 mL] 4450 mL PEEP:  [10 cmH20] 10 cmH20 Plateau Pressure:  [28 cmH20] 28 cmH20 INTAKE / OUTPUT:  Intake/Output Summary (Last 24 hours) at 12/18/2013 1915 Last data filed at 12/16/2013 1912  Gross per 24 hour  Intake     50 ml  Output      0 ml  Net     50 ml    PHYSICAL EXAMINATION: General:  Obese, vegetative state, NAD Neuro:  Awake, Non-purposeful facial / eye movements, non-responsive HEENT:  Chronic trache Cardiovascular:  RRR, no murmurs Lungs:  Bilateral coarse breath sounds, no wheezing, no focal crackles. Abdomen:  Obese, soft, non-distended, +active BS in all quads, PEG tube Musculoskeletal:  B/l +1 pitting edema lower and upper ext Skin:  Warm, some diaphoresis, sacral decub with dressing  LABS:  CBC  Recent Labs Lab 2014-01-07 1713 01/07/14 1806  WBC 14.0*  --   HGB 4.6* 5.8*  HCT 14.3* 17.0*  PLT 245  --    Coag's  Recent Labs Lab January 07, 2014 1713  APTT 43*  INR 1.66*   BMET  Recent Labs Lab 01-07-2014 1713 01-07-14 1806  NA 137 137  K 6.5* 6.3*  CL 97 105  CO2 16*  --   BUN 174* >140*  CREATININE 3.90* 4.80*  GLUCOSE 321* 325*   Electrolytes  Recent Labs Lab Jan 07, 2014 1713  CALCIUM 8.4   Sepsis Markers  Recent Labs Lab 01-07-14 1807  LATICACIDVEN 1.27   ABG  Recent Labs Lab 07-Jan-2014 1748  PHART 7.270*  PCO2ART 42.7  PO2ART 107.0*   Liver Enzymes  Recent Labs Lab 01/07/2014 1713  AST 68*  ALT 42*  ALKPHOS 339*  BILITOT 1.1  ALBUMIN 2.0*   Cardiac Enzymes No results found for this basename: TROPONINI, PROBNP,  in the last 168 hours Glucose  Recent Labs Lab 12/21/13 2113  GLUCAP 134*    Imaging No results found.   ASSESSMENT / PLAN:  PULMONARY A: Chronic respiratory failure, trach dependent Recent HCAP, previously treated - CXR 7/30 improved P:   Monitor in ICU Full  vent support, 8cc/kg, RR 15 Supplemental O2 PRN, goal SpO2 >92% Repeat ABG in AM  CARDIOVASCULAR A: High risk for shock (hemorrhagic / hypovolemic, and possible septic) HTN, pre-admit Lactate normal - 1.27 Full Code per family - note, multiple conversations with daughter in past regarding code status/goals of care, continue to wish for full code. P:  Monitor BP, MAP goal >65, currently pressure stable Hold home BP meds - Hydralazine, Minoxidil Repeat lactate in AM  RENAL A:  AKI worsening in setting of CKD - Cr up to 4.80, BUN 174, suspected pre-renal azotemia  Hyperkalemia - to 6.5, s/p insulin P:   IVF rehydration Kayexalate 60g x 1 dose per tube Monitor BMET No indications for urgent HD, medically manage hyperK / AKI  GASTROINTESTINAL A: Acute GI Bleed, with melena - FOBT (positive) PEG dependent GERD P:   Consulted GI ([facility]) will follow, plan to see pt in AM Start Protonix IV bolus and gtt Transfuse PRBC, IVF Monitor for re-bleed NPO, strict - no feeds through PEG  HEMATOLOGIC A: Acute Blood Loss Anemia - Hgb 4.6 >> up to 5.8 Acute on chronic anemia P:  Transfuse 3u PRBC CBC q 4 hr VTE prophylaxis - SCDs Check coags  INFECTIOUS A: Recently treated for HCAP - Improved CXR 7/30, mild elevated WBC to 14, afebrile P:   Hold broad spectrum abx s/p 1x dose of Vancomycin / Zosyn Blood cultures x 2, resp culture Trend PCT, determine if need to resume abx  ENDOCRINE A: DM, Type 2 Hyperglycemia   P:   Resistant SSI CBG monitoring q 4 hr Hold Lantus while NPO  NEUROLOGIC A: Persistent vegetative state, s/p ICH Hx Seizures P:   Resume Keppra in AM  GLOBAL: Code Status - FULL, reviewed chart and previous hospitalization. Attempted to call family to discuss code status, unable to reach.    Saralyn Pilar, DO [facility], PGY-2  January 07, 2014, 7:15 PM  Attending:  I have seen and examined the patient with nurse  practitioner/resident and agree with the note above.    Need palliative care here Hemodiaylsis would be of no medical benefit  I have personally obtained a history, examined  the patient, evaluated laboratory and imaging results, formulated the assessment and plan and placed orders. CRITICAL CARE: The patient is critically ill with multiple organ systems failure and requires high complexity decision making for assessment and support, frequent evaluation and titration of therapies, application of advanced monitoring technologies and extensive interpretation of multiple databases. Critical Care Time devoted to patient care services described in this note is 50 minutes.   Heber CarolinaBrent McQuaid, MD [place] [facility] Pager: [phone] Cell: [phone] If no response, call [phone]

## 2013-12-28 NOTE — Progress Notes (Signed)
**Note De-identified Kamarii Carton Obfuscation** Sputum collected and sent to lab 

## 2013-12-28 NOTE — ED Notes (Signed)
[facility] complaining of abnormal labs. BUN 170, Creat 4.5, hgb 4.6. Per [facility], pt to be sent to ED for further eval per family's request

## 2013-12-28 NOTE — ED Notes (Signed)
Labs results given to EDP. 

## 2013-12-28 NOTE — ED Notes (Signed)
Dr. Silverio LayYao at the bedside to verify PICC line placement. Stated that PICC is in correct location.

## 2013-12-28 NOTE — ED Notes (Signed)
Dr. Floyd at bedside. 

## 2013-12-28 NOTE — ED Notes (Signed)
Nolon RodNeisha, pt's daughter on phone speaking to Dr.Floyd

## 2013-12-29 ENCOUNTER — Inpatient Hospital Stay (HOSPITAL_COMMUNITY): Payer: Medicare Other

## 2013-12-29 DIAGNOSIS — K922 Gastrointestinal hemorrhage, unspecified: Secondary | ICD-10-CM | POA: Diagnosis not present

## 2013-12-29 DIAGNOSIS — J189 Pneumonia, unspecified organism: Secondary | ICD-10-CM

## 2013-12-29 DIAGNOSIS — K921 Melena: Secondary | ICD-10-CM | POA: Diagnosis not present

## 2013-12-29 LAB — CBC
HCT: 22.2 % — ABNORMAL LOW (ref 36.0–46.0)
HCT: 22.8 % — ABNORMAL LOW (ref 36.0–46.0)
HCT: 23 % — ABNORMAL LOW (ref 36.0–46.0)
HCT: 21.2 % — ABNORMAL LOW (ref 36.0–46.0)
HEMOGLOBIN: 7.4 g/dL — AB (ref 12.0–15.0)
Hemoglobin: 7.3 g/dL — ABNORMAL LOW (ref 12.0–15.0)
Hemoglobin: 7.4 g/dL — ABNORMAL LOW (ref 12.0–15.0)
Hemoglobin: 7 g/dL — ABNORMAL LOW (ref 12.0–15.0)
MCH: 28.3 pg (ref 26.0–34.0)
MCH: 27.9 pg (ref 26.0–34.0)
MCH: 28.2 pg (ref 26.0–34.0)
MCH: 28.1 pg (ref 26.0–34.0)
MCHC: 32.9 g/dL (ref 30.0–36.0)
MCHC: 32.5 g/dL (ref 30.0–36.0)
MCHC: 32.2 g/dL (ref 30.0–36.0)
MCHC: 33 g/dL (ref 30.0–36.0)
MCV: 86 fL (ref 78.0–100.0)
MCV: 86 fL (ref 78.0–100.0)
MCV: 87.8 fL (ref 78.0–100.0)
MCV: 85.1 fL (ref 78.0–100.0)
PLATELETS: 208 10*3/uL (ref 150–400)
PLATELETS: 208 10*3/uL (ref 150–400)
Platelets: 204 10*3/uL (ref 150–400)
Platelets: 219 10*3/uL (ref 150–400)
RBC: 2.58 MIL/uL — ABNORMAL LOW (ref 3.87–5.11)
RBC: 2.65 MIL/uL — ABNORMAL LOW (ref 3.87–5.11)
RBC: 2.62 MIL/uL — AB (ref 3.87–5.11)
RBC: 2.49 MIL/uL — ABNORMAL LOW (ref 3.87–5.11)
RDW: 18.3 % — ABNORMAL HIGH (ref 11.5–15.5)
RDW: 18.9 % — AB (ref 11.5–15.5)
RDW: 19.3 % — ABNORMAL HIGH (ref 11.5–15.5)
RDW: 19.1 % — AB (ref 11.5–15.5)
WBC: 12.1 10*3/uL — ABNORMAL HIGH (ref 4.0–10.5)
WBC: 13.5 10*3/uL — ABNORMAL HIGH (ref 4.0–10.5)
WBC: 12.9 10*3/uL — AB (ref 4.0–10.5)
WBC: 14 10*3/uL — AB (ref 4.0–10.5)

## 2013-12-29 LAB — POCT I-STAT 3, ART BLOOD GAS (G3+)
ACID-BASE DEFICIT: 7 mmol/L — AB (ref 0.0–2.0)
ACID-BASE DEFICIT: 5 mmol/L — AB (ref 0.0–2.0)
BICARBONATE: 21.3 meq/L (ref 20.0–24.0)
Bicarbonate: 20.2 mEq/L (ref 20.0–24.0)
O2 Saturation: 85 %
O2 Saturation: 99 %
PCO2 ART: 45.1 mmHg — AB (ref 35.0–45.0)
PH ART: 7.252 — AB (ref 7.350–7.450)
PH ART: 7.28 — AB (ref 7.350–7.450)
PO2 ART: 170 mmHg — AB (ref 80.0–100.0)
Patient temperature: 97.5
TCO2: 22 mmol/L (ref 0–100)
TCO2: 23 mmol/L (ref 0–100)
pCO2 arterial: 45.5 mmHg — ABNORMAL HIGH (ref 35.0–45.0)
pO2, Arterial: 57 mmHg — ABNORMAL LOW (ref 80.0–100.0)

## 2013-12-29 LAB — URINALYSIS, ROUTINE W REFLEX MICROSCOPIC
Glucose, UA: NEGATIVE mg/dL
Glucose, UA: NEGATIVE mg/dL
KETONES UR: NEGATIVE mg/dL
Ketones, ur: NEGATIVE mg/dL
NITRITE: NEGATIVE
NITRITE: NEGATIVE
PH: 5.5 (ref 5.0–8.0)
Protein, ur: >300 mg/dL — AB
SPECIFIC GRAVITY, URINE: 1.026 (ref 1.005–1.030)
Specific Gravity, Urine: 1.027 (ref 1.005–1.030)
UROBILINOGEN UA: 0.2 mg/dL (ref 0.0–1.0)
Urobilinogen, UA: 0.2 mg/dL (ref 0.0–1.0)
pH: 5 (ref 5.0–8.0)

## 2013-12-29 LAB — BASIC METABOLIC PANEL
ANION GAP: 21 — AB (ref 5–15)
Anion gap: 21 — ABNORMAL HIGH (ref 5–15)
BUN: 165 mg/dL — ABNORMAL HIGH (ref 6–23)
BUN: 171 mg/dL — AB (ref 6–23)
CALCIUM: 8.7 mg/dL (ref 8.4–10.5)
CO2: 18 mEq/L — ABNORMAL LOW (ref 19–32)
CO2: 19 mEq/L (ref 19–32)
CREATININE: 3.86 mg/dL — AB (ref 0.50–1.10)
Calcium: 8.4 mg/dL (ref 8.4–10.5)
Chloride: 101 mEq/L (ref 96–112)
Chloride: 102 mEq/L (ref 96–112)
Creatinine, Ser: 3.93 mg/dL — ABNORMAL HIGH (ref 0.50–1.10)
GFR calc Af Amer: 14 mL/min — ABNORMAL LOW (ref >90)
GFR, EST AFRICAN AMERICAN: 13 mL/min — AB (ref >90)
GFR, EST NON AFRICAN AMERICAN: 12 mL/min — AB (ref >90)
GFR, EST NON AFRICAN AMERICAN: 12 mL/min — AB (ref >90)
GLUCOSE: 192 mg/dL — AB (ref 70–99)
GLUCOSE: 300 mg/dL — AB (ref 70–99)
Potassium: 6.1 mEq/L — ABNORMAL HIGH (ref 3.7–5.3)
Potassium: 5.3 mEq/L (ref 3.7–5.3)
SODIUM: 142 meq/L (ref 137–147)
Sodium: 140 mEq/L (ref 137–147)

## 2013-12-29 LAB — GLUCOSE, CAPILLARY
GLUCOSE-CAPILLARY: 345 mg/dL — AB (ref 70–99)
GLUCOSE-CAPILLARY: 284 mg/dL — AB (ref 70–99)
GLUCOSE-CAPILLARY: 97 mg/dL (ref 70–99)
GLUCOSE-CAPILLARY: 98 mg/dL (ref 70–99)
Glucose-Capillary: 113 mg/dL — ABNORMAL HIGH (ref 70–99)
Glucose-Capillary: 175 mg/dL — ABNORMAL HIGH (ref 70–99)
Glucose-Capillary: 274 mg/dL — ABNORMAL HIGH (ref 70–99)
Glucose-Capillary: 284 mg/dL — ABNORMAL HIGH (ref 70–99)
Glucose-Capillary: 57 mg/dL — ABNORMAL LOW (ref 70–99)
Glucose-Capillary: 78 mg/dL (ref 70–99)

## 2013-12-29 LAB — URINE MICROSCOPIC-ADD ON

## 2013-12-29 LAB — PROCALCITONIN: Procalcitonin: 4.15 ng/mL

## 2013-12-29 LAB — VANCOMYCIN, RANDOM: VANCOMYCIN RM: 63.8 ug/mL — AB

## 2013-12-29 LAB — LACTIC ACID, PLASMA: Lactic Acid, Venous: 1.4 mmol/L (ref 0.5–2.2)

## 2013-12-29 LAB — SODIUM, URINE, RANDOM: Sodium, Ur: 52 mEq/L

## 2013-12-29 MED ORDER — DEXTROSE 10 % IV SOLN
INTRAVENOUS | Status: DC
  Administered 2013-12-29: 13:00:00 via INTRAVENOUS

## 2013-12-29 MED ORDER — FENTANYL CITRATE 0.05 MG/ML IJ SOLN
25.0000 ug | INTRAMUSCULAR | Status: DC | PRN
  Administered 2013-12-29 (×2): 50 ug via INTRAVENOUS
  Administered 2013-12-29 – 2014-01-07 (×15): 100 ug via INTRAVENOUS
  Filled 2013-12-29 (×19): qty 2

## 2013-12-29 MED ORDER — DEXTROSE 50 % IV SOLN: 50.0000 mL | Freq: Once | INTRAVENOUS | Status: AC | PRN

## 2013-12-29 MED ORDER — SODIUM CHLORIDE 0.45 % IV SOLN
INTRAVENOUS | Status: DC
  Administered 2013-12-29: 1 mL via INTRAVENOUS

## 2013-12-29 MED ORDER — DEXTROSE 5 % IV SOLN
INTRAVENOUS | Status: DC
  Administered 2013-12-29 – 2013-12-31 (×4): via INTRAVENOUS
  Filled 2013-12-29 (×6): qty 150

## 2013-12-29 MED ORDER — ALTEPLASE 2 MG IJ SOLR
2.0000 mg | Freq: Once | INTRAMUSCULAR | Status: AC
  Administered 2013-12-29: 2 mg
  Filled 2013-12-29: qty 2

## 2013-12-29 MED ORDER — DEXTROSE 50 % IV SOLN
INTRAVENOUS | Status: AC
  Filled 2013-12-29: qty 50

## 2013-12-29 NOTE — Progress Notes (Signed)
INITIAL NUTRITION ASSESSMENT  DOCUMENTATION CODES Per approved criteria  -Obesity Unspecified   INTERVENTION: - If pt not expected to regain use of GI tract in 7-10 days, and aggressive care is warranted, recommend TPN per pharmacy. - If TF is able to be started, recommend utilizing 20M PEPuP Protocol: initiate TF via PEG with Vital HP at 25 ml/h on day 1; on day 2, increase to goal rate of 50 ml/h (1200 ml per day) to provide 1200 kcals, 105 gm protein, 1003 ml free water daily. - RD will continue to monitor for nutrition care plan.  NUTRITION DIAGNOSIS: Inadequate oral intake related to inability to eat as evidenced by NPO.   Goal: Enteral nutrition to provide 60-70% of estimated calorie needs (22-25 kcals/kg ideal body weight) and 100% of estimated protein needs, based on ASPEN guidelines for permissive underfeeding in critically ill obese individuals  Monitor:  Pt to meet >/= 90% of their estimated nutrition needs   Reason for Assessment: New Vent  58 y.o. female  Admitting Dx: <principal problem not specified>  ASSESSMENT: 58 y.o. female in a persistent vegetative state s/p ICH in 01/2013 which was complicated by respiratory failure. She was transferred from [facility] with anemia requiring transfusion with a hx of the same. She has been transfused three units and her hgb has risen from 4.6 on 12/20/2013 to 7.4 today. Nursing staff states pt had an episode of black tarry stools.  Patient is currently intubated on ventilator support MV: 10.6 L/min Temp (24hrs), Avg:96.4 F (35.8 C), Min:93.8 F (34.3 C), Max:97.7 F (36.5 C)  - Per RN, waiting on gastroenterologist to start TF due to GI bleed. - Pt with no signs of fat and/or muscle wasting.  K and Na WNL  Height: Ht Readings from Last 1 Encounters:  12/17/2013 5' 1.81" (1.57 m)    Weight: Wt Readings from Last 1 Encounters:  12/29/13 187 lb 9.8 oz (85.1 kg)    Ideal Body Weight: 49.7 kg  % Ideal Body Weight:  171%  Wt Readings from Last 10 Encounters:  12/29/13 187 lb 9.8 oz (85.1 kg)  12/20/13 199 lb 4.7 oz (90.4 kg)    Usual Body Weight: unknown  % Usual Body Weight: unknown  BMI:  Body mass index is 34.52 kg/(m^2).  Estimated Nutritional Needs: Kcal: 1100-1250 Protein: 100-110 g Fluid: Per MD  Skin: Stage I pressure ulcer on sacrum  Diet Order: NPO  EDUCATION NEEDS: -Education not appropriate at this time   Intake/Output Summary (Last 24 hours) at 12/29/13 1308 Last data filed at 12/29/13 1212  Gross per 24 hour  Intake 3212.75 ml  Output    110 ml  Net 3102.75 ml    Last BM: 7/31   Labs:   Recent Labs Lab 12/02/2013 1713 12/05/2013 1806 12/29/13 12/29/13 0400  NA 137 137 140 142  K 6.5* 6.3* 6.1* 5.3  CL 97 105 101 102  CO2 16*  --  18* 19  BUN 174* >140* 171* 165*  CREATININE 3.90* 4.80* 3.86* 3.93*  CALCIUM 8.4  --  8.7 8.4  GLUCOSE 321* 325* 300* 192*    CBG (last 3)   Recent Labs  12/29/13 0814 12/29/13 1200 12/29/13 1238  GLUCAP 97 57* 113*    Scheduled Meds: . sodium chloride   Intravenous Once  . antiseptic oral rinse  7 mL Mouth Rinse QID  . chlorhexidine  15 mL Mouth Rinse BID  . insulin aspart  0-20 Units Subcutaneous 6 times per day  . [  START ON 01/01/2014] pantoprazole (PROTONIX) IV  40 mg Intravenous Q12H  . sodium chloride  10-40 mL Intracatheter Q12H    Continuous Infusions: . sodium chloride 1 mL (12/29/13 1000)  . dextrose 30 mL/hr at 12/29/13 1250  . pantoprozole (PROTONIX) infusion 8 mg/hr (12/29/13 0900)    Past Medical History  Diagnosis Date  . Chronic respiratory failure   . Tracheostomy status   . Persist vegetative state   . 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 Type II diabetes mellitus   . HTN (hypertension)   . Anemia, unspecified   . CKD (chronic kidney disease) stage 2, GFR 60-89 ml/min   . Corneal abscess     history of   . Gastro-esophageal reflux   . Chronic constipation   .  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 Peg tube placement      unknown insertion date   . Tracheostomy      unknown insertion site    Ebbie LatusHaley Hawkins RD, LDN

## 2013-12-29 NOTE — Progress Notes (Signed)
CRITICAL VALUE ALERT  Critical value received:  Vanc level 63.8  Date of notification:  12/29/2013   Time of notification:  1437  Critical value read back:Yes.    Nurse who received alert:  Diona Fantihris Mckeown  MD notified (1st page):  Canary BrimBrandi Ollis NP  Time of first page:  1438  MD notified (2nd page):  Time of second page:  Responding MD:  Canary BrimBrandi Ollis NP  Time MD responded:  [phone]

## 2013-12-29 NOTE — Consult Note (Signed)
Referring Provider: No ref. provider found Primary Care Physician:  Pcp Not In System Primary Nephrologist:  none  Reason for Consultation:   Acute renal failure secondary to shock, GI bleed  HPI: 58 y.o. female in a persistent vegetative state s/p ICH in 01/2013 which was complicated by respiratory failure. She was transferred from [facility] with anemia requiring transfusion. PEG dependent, tracheostomy.  Creatinine 1.5  7/22 and  1.26  7/21.  [facility] with HCAP and treated with Vanc and Cefepime. She was transferred back to [facility] after stabilization.   On admission she was found to have a creatinine of > 140 and K 6.3 and Creatinine of 4.8. This has improved with fluid and blood resuscitation  There appear to be no nephrotoxic insult although I think chcking a vancomycin level is reasonable   Past Medical History  Diagnosis Date  . Chronic respiratory failure   . Tracheostomy status   . Persist vegetative state   . 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 Type II diabetes mellitus   . HTN (hypertension)   . Anemia, unspecified   . CKD (chronic kidney disease) stage 2, GFR 60-89 ml/min   . Corneal abscess     history of   . Gastro-esophageal reflux   . Chronic constipation   . 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 Peg tube placement      unknown insertion date   . Tracheostomy      unknown insertion site    Prior to Admission medications   Medication Sig Start Date End Date Taking? Authorizing Provider  acetaminophen (TYLENOL) 160 MG/5ML liquid Place 650 mg into feeding tube every 6 (six) hours as needed for fever.   Yes Historical Provider, MD  atropine 1 % ophthalmic solution Place 1 drop into both eyes 3 (three) times daily.   Yes Historical Provider, MD  carboxymethylcellulose (REFRESH PLUS) 0.5 % SOLN Apply 2 drops to  eye every 8 (eight) hours as needed (dry irritated eyes).   Yes Historical Provider, MD  chlorhexidine (PERIDEX) 0.12 % solution Use as directed 15 mLs in the mouth or throat 2 (two) times daily. For trach tube   Yes Historical Provider, MD  docusate (COLACE) 50 MG/5ML liquid Place 100 mg into feeding tube daily.   Yes Historical Provider, MD  ferrous sulfate 300 (60 FE) MG/5ML syrup Place 300 mg into feeding tube every 12 (twelve) hours.   Yes Historical Provider, MD  furosemide (LASIX) 10 MG/ML solution Place 40 mg into feeding tube every morning.    Yes Historical Provider, MD  guaiFENesin (ROBITUSSIN) 100 MG/5ML liquid Place 200 mg into feeding tube every 4 (four) hours as needed (thick secretions).   Yes Historical Provider, MD  hydrALAZINE (APRESOLINE) 100 MG tablet Give 50 mg by tube 3 (three) times daily.    Yes Historical Provider, MD  insulin glargine (LANTUS) 100 UNIT/ML injection Inject 16 Units into the skin every morning.   Yes Historical Provider, MD  insulin regular (NOVOLIN R,HUMULIN R) 100 units/mL injection Inject 0-5 Units into the skin 3 (three) times daily before meals. 0-150= 0 units 151-200= 1 unit 201-250= 2 units 251-300= 3 units 301-350= 4 units 351-400=5 units >400 call physician   Yes Historical Provider, MD  lansoprazole (PREVACID SOLUTAB) 30 MG disintegrating tablet Place 30 mg into feeding tube every  morning.    Yes Historical Provider, MD  levETIRAcetam (KEPPRA) 100 MG/ML solution Place 500 mg into feeding tube every 12 (twelve) hours.   Yes Historical Provider, MD  Lidocaine 0.5 % AERO Take 2 mLs by nebulization every 4 (four) hours as needed (cough related to Chronic Respiratory Failure).   Yes Historical Provider, MD  minoxidil (LONITEN) 2.5 MG tablet Place 2.5 mg into feeding tube daily.   Yes Historical Provider, MD  Nutritional Supplements (FEEDING SUPPLEMENT, VITAL HIGH PROTEIN,) LIQD liquid 18ml/hr 12/21/13  Yes Bernadene Person, NP  polyethylene glycol  (MIRALAX / GLYCOLAX) packet Place 17 g into feeding tube every 12 (twelve) hours as needed for moderate constipation.    Yes Historical Provider, MD  scopolamine (TRANSDERM-SCOP) 1 MG/3DAYS Place 1 patch onto the skin every 3 (three) days.   Yes Historical Provider, MD  sennosides (SENOKOT) 8.8 MG/5ML syrup Place 10 mLs into feeding tube every morning.    Yes Historical Provider, MD  ceFEPIme 1 g in dextrose 5 % 50 mL Inject 1 g into the vein daily. 12/21/13   Bernadene Person, NP  vancomycin 1,000 mg in sodium chloride 0.9 % 250 mL Inject 1,000 mg into the vein every 12 (twelve) hours.    Historical Provider, MD    Current Facility-Administered Medications  Medication Dose Route Frequency Provider Last Rate Last Dose  . 0.9 %  sodium chloride infusion  250 mL Intravenous PRN Duayne Cal, NP      . 0.9 %  sodium chloride infusion   Intravenous Continuous Saralyn Pilar, DO 10 mL/hr at 12/29/13 0900 1 mL at 12/29/13 0900  . 0.9 %  sodium chloride infusion   Intravenous Once Duayne Cal, NP      . antiseptic oral rinse (CPC / CETYLPYRIDINIUM CHLORIDE 0.05%) solution 7 mL  7 mL Mouth Rinse QID Lupita Leash, MD   7 mL at 12/29/13 0000  . chlorhexidine (PERIDEX) 0.12 % solution 15 mL  15 mL Mouth Rinse BID Lupita Leash, MD   15 mL at 12/29/13 0850  . insulin aspart (novoLOG) injection 0-20 Units  0-20 Units Subcutaneous 6 times per day Rahul Astrid Drafts, PA-C   4 Units at 12/29/13 0500  . pantoprazole (PROTONIX) 80 mg in sodium chloride 0.9 % 250 mL (0.32 mg/mL) infusion  8 mg/hr Intravenous Continuous Duayne Cal, NP 25 mL/hr at 12/29/13 0900 8 mg/hr at 12/29/13 0900  . [START ON 01/01/2014] pantoprazole (PROTONIX) injection 40 mg  40 mg Intravenous Q12H Duayne Cal, NP      . sodium chloride 0.9 % injection 10-40 mL  10-40 mL Intracatheter Q12H Richardean Canal, MD      . sodium chloride 0.9 % injection 10-40 mL  10-40 mL Intracatheter PRN Richardean Canal, MD        Allergies as of  12/06/2013 - Review Complete 12/07/2013  Allergen Reaction Noted  . Calcium channel blockers Other (See Comments) 12/19/2013  . Haldol [haloperidol lactate] Other (See Comments) 12/19/2013  . Hctz [hydrochlorothiazide] Other (See Comments) 12/19/2013  . Lopressor [metoprolol tartrate] Other (See Comments) 12/19/2013    No family history on file.  History   Social History  . Marital Status: Single    Spouse Name: N/A    Number of Children: N/A  . Years of Education: N/A   Occupational History  . Not on file.   Social History Main Topics  . Smoking status: Not on file  . Smokeless tobacco:  Not on file  . Alcohol Use: Not on file  . Drug Use: Not on file  . Sexual Activity: Not on file   Other Topics Concern  . Not on file   Social History Narrative  . No narrative on file    Review of Systems: Unobtainable Physical Exam: Vital signs in last 24 hours: Temp:  [93.8 F (34.3 C)-97.5 F (36.4 C)] 97.5 F (36.4 C) (07/31 0815) Pulse Rate:  [70-96] 78 (07/31 1000) Resp:  [0-95] 21 (07/31 1000) BP: (97-143)/(26-71) 109/62 mmHg (07/31 1000) SpO2:  [61 %-99 %] 94 % (07/31 1000) FiO2 (%):  [50 %-80 %] 70 % (07/31 0845) Weight:  [85.1 kg (187 lb 9.8 oz)-90.4 kg (199 lb 4.7 oz)] 85.1 kg (187 lb 9.8 oz) (07/31 0500) Last BM Date: 12/29/13 General:   ILL appearing Head:  Normocephalic and atraumatic. Eyes:  Sclera clear, no icterus.   Conjunctiva pale Ears:  Normal auditory acuity. Nose:  No deformity, discharge,  or lesions. Mouth:  No deformity or lesions, dentition normal. Neck:  Tracheostomy. JVP not elevated Lungs:  Rhonchi  Heart:  Regular rate and rhythm; no murmurs, clicks, rubs,  or gallops. Abdomen:  Soft, nontender and nondistended.slightly firm Msk:  Symmetrical without gross deformities. Normal posture. Pulses:  No carotid, renal, femoral bruits. DP and PT symmetrical and equal Extremities:  Without clubbing or edema. Neurologic:  Eyes open unresponsive not  following commands Skin:  Intact without significant lesions or rashes. Cervical Nodes:  No significant cervical adenopathy. Psych:  Alert and cooperative. Normal mood and affect.  Intake/Output from previous day: 07/30 0701 - 07/31 0700 In: 2901.3 [I.V.:2031.3; Blood:870] Out: 105 [Urine:105] Intake/Output this shift: Total I/O In: 186.5 [I.V.:186.5] Out: 5 [Urine:5]  Lab Results:  Recent Labs  09/20/13 1713 09/20/13 1806 12/29/13 0400 12/29/13 0759  WBC 14.0*  --  12.1* 13.5*  HGB 4.6* 5.8* 7.3* 7.4*  HCT 14.3* 17.0* 22.2* 22.8*  PLT 245  --  204 219   BMET  Recent Labs  09/20/13 1713 09/20/13 1806 12/29/13 12/29/13 0400  NA 137 137 140 142  K 6.5* 6.3* 6.1* 5.3  CL 97 105 101 102  CO2 16*  --  18* 19  GLUCOSE 321* 325* 300* 192*  BUN 174* >140* 171* 165*  CREATININE 3.90* 4.80* 3.86* 3.93*  CALCIUM 8.4  --  8.7 8.4   LFT  Recent Labs  09/20/13 1713  PROT 7.9  ALBUMIN 2.0*  AST 68*  ALT 42*  ALKPHOS 339*  BILITOT 1.1   PT/INR  Recent Labs  09/20/13 1713  LABPROT 19.6*  INR 1.66*   Hepatitis Panel No results found for this basename: HEPBSAG, HCVAB, HEPAIGM, HEPBIGM,  in the last 72 hours  Studies/Results: Dg Chest Port 1 View  12/29/2013   CLINICAL DATA:  Possible pneumonia  EXAM: PORTABLE CHEST - 1 VIEW  COMPARISON:  2013-12-10  FINDINGS: Tracheostomy tube is unchanged in position. Persistent diffuse bilateral alveolar and airspace opacities. Findings may be due to bilateral pulmonary edema or bilateral pneumonia. No pneumothorax.  IMPRESSION: No significant change. Diffuse bilateral airspace disease and interstitial prominence again noted. Findings may be due to bilateral pulmonary edema or bilateral pneumonia.   Electronically Signed   By: Natasha MeadLiviu  Pop M.D.   On: 12/29/2013 07:59   Dg Chest Portable 1 View  02/11/14   CLINICAL DATA:  Verify PICC placement.  EXAM: PORTABLE CHEST - 1 VIEW  COMPARISON:  Chest radiograph December 20, 2013  FINDINGS:  Right  PICC tip projects in proximal superior vena cava both, no apparent change in position. Tracheostomy tube tip projects 5 cm above the carina.  Cardiomediastinal silhouette is nonsuspicious. Diffuse interstitial and to lesser extent alveolar airspace opacities are decreased. Mildly elevated right hemidiaphragm. No pleural effusions. No pneumothorax though, lung apices incompletely imaged. Soft tissue planes and included osseous structures are unchanged.  IMPRESSION: Right PICC distal tip projects in proximal superior vena cava, no apparent change in position. Tracheostomy tube tip projects 5 cm above the carina.  Decreased diffuse interstitial and alveolar airspace opacities.   Electronically Signed   By: Awilda Metro   On: 12/27/2013 17:48    Assessment/Plan:  This is a very sad situation of a young lady in a persistent vegitative state with recurrent GI bleed Her renal failure is most likely ischemic ATN versus volume depletion and is responding nicely to resuscitation.  I do not think chronic dialysis is appropriate and should not be considered  Hyperkalemia resolved  Metabolic acidosis improved  HTN/volume stable   LOS: 1 [name] W @TODAY @11 :08 AM

## 2013-12-29 NOTE — Progress Notes (Signed)
   Spoke to daughter Theone StanleyMeisha [name] - she is in [place] and plans to be here tomorrow - with other family that is coming from [place]  I explained need for EGD and will plan to do tomorrow and get consent then.

## 2013-12-29 NOTE — Progress Notes (Signed)
PULMONARY / CRITICAL CARE MEDICINE   Name: [name] MRN: [id number] DOB: 08-30-1955    ADMISSION DATE:  12/05/2013 CONSULTATION DATE:  12/29/2013  REFERRING MD :  [facility] >> EDP  CHIEF COMPLAINT:  Acute anemia, GIB  INITIAL PRESENTATION: 58 yo trach dependent female with recent ICH 01/2013 complicated by respiratory failure. 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 She was transfered from [facility] with anemia requiring transfusion.  STUDIES:  7/30 - FOBT (Positive)  SIGNIFICANT EVENTS: 01/2014 - ICH > resulting in chronic resp failure with trach status.  7/21 > 7/23 - Admission for anemia requiring transfusion 7/30 - admitted for anemia Hgb 4.6, transfuse PRBC, consulted GI, HyperK to 6.5, worsening AKI 7/31 - Hgb improved to 7.3 (s/p 3u PRBC), K down to 5.3 (s/p kayexalate), Lactic acid 1.4, PCT stable 4.15         - Remains on vent support, resolved bleeding, CXR (inc b/l interstitial opacities edema vs PNA)  ANTIBIOTICS: Vancomycin 7/30 x 1 dose Zosyn 7/30 x 1 dose  CULTURES: Blood x2 7/30 >>> Respiratory 7/30 >>>  LINES/TUBES: Tracheostomy, chronic PIVs  SUBJECTIVE:  Per RN, no acute events overnight, no further bloody BMs, remains on vent support. Patient remains in persistent vegetative state. No family at bedside this AM.  VITAL SIGNS: Temp:  [93.8 F (34.3 C)-97.5 F (36.4 C)] 97.3 F (36.3 C) (07/31 0431) Pulse Rate:  [70-96] 77 (07/31 0600) Resp:  [0-95] 15 (07/31 0600) BP: (108-143)/(26-64) 132/61 mmHg (07/31 0500) SpO2:  [61 %-99 %] 94 % (07/31 0600) FiO2 (%):  [50 %-80 %] 70 % (07/31 0400) Weight:  [187 lb 9.8 oz (85.1 kg)-199 lb 4.7 oz (90.4 kg)] 187 lb 9.8 oz (85.1 kg) (07/31 0500) HEMODYNAMICS:   VENTILATOR SETTINGS: Vent Mode:  [-] PRVC FiO2 (%):  [50 %-80 %] 70 % Set Rate:  [16 bmp] 16 bmp Vt Set:  [450 mL-4450 mL] 450 mL PEEP:  [10 cmH20] 10 cmH20 Plateau Pressure:  [27 cmH20-28 cmH20] 27 cmH20 INTAKE /  OUTPUT:  Intake/Output Summary (Last 24 hours) at 12/29/13 0641 Last data filed at 12/29/13 0500  Gross per 24 hour  Intake 2651.25 ml  Output    105 ml  Net 2546.25 ml    PHYSICAL EXAMINATION: General:  Obese, vegetative state, NAD Neuro:  Awake, Non-purposeful facial / eye movements, some withdrawal to pain HEENT:  Chronic trach on vent Cardiovascular:  RRR, no murmurs Lungs:  Persistent bilateral coarse breath sounds, no wheezing, no focal crackles. Abdomen:  Obese, soft, non-distended, +active BS in all quads, PEG tube Musculoskeletal: persistent b/l +1 pitting edema lower and upper ext Skin:  Warm, sacral decub with dressing  LABS:  CBC  Recent Labs Lab 12/07/2013 1713 12/21/2013 1806 12/29/13 0400  WBC 14.0*  --  12.1*  HGB 4.6* 5.8* 7.3*  HCT 14.3* 17.0* 22.2*  PLT 245  --  204   Coag's  Recent Labs Lab 12/26/2013 1713  APTT 43*  INR 1.66*   BMET  Recent Labs Lab 12/16/2013 1713 12/25/2013 1806 12/29/13 12/29/13 0400  NA 137 137 140 142  K 6.5* 6.3* 6.1* 5.3  CL 97 105 101 102  CO2 16*  --  18* 19  BUN 174* >140* 171* 165*  CREATININE 3.90* 4.80* 3.86* 3.93*  GLUCOSE 321* 325* 300* 192*   Electrolytes  Recent Labs Lab 12/03/2013 1713 12/29/13 12/29/13 0400  CALCIUM 8.4 8.7 8.4   Sepsis Markers  Recent Labs Lab 12/14/2013 1807 12/21/2013  2003 12/29/13 0400  LATICACIDVEN 1.27  --  1.4  PROCALCITON  --  4.25 4.15   ABG  Recent Labs Lab March 30, 2014 1748  PHART 7.270*  PCO2ART 42.7  PO2ART 107.0*   Liver Enzymes  Recent Labs Lab March 30, 2014 1713  AST 68*  ALT 42*  ALKPHOS 339*  BILITOT 1.1  ALBUMIN 2.0*   Cardiac Enzymes No results found for this basename: TROPONINI, PROBNP,  in the last 168 hours Glucose  Recent Labs Lab March 30, 2014 2347 12/29/13 0422  GLUCAP 284* 175*    Imaging Dg Chest Portable 1 View  07-27-2013   CLINICAL DATA:  Verify PICC placement.  EXAM: PORTABLE CHEST - 1 VIEW  COMPARISON:  Chest radiograph December 20, 2013   FINDINGS: Right PICC tip projects in proximal superior vena cava both, no apparent change in position. Tracheostomy tube tip projects 5 cm above the carina.  Cardiomediastinal silhouette is nonsuspicious. Diffuse interstitial and to lesser extent alveolar airspace opacities are decreased. Mildly elevated right hemidiaphragm. No pleural effusions. No pneumothorax though, lung apices incompletely imaged. Soft tissue planes and included osseous structures are unchanged.  IMPRESSION: Right PICC distal tip projects in proximal superior vena cava, no apparent change in position. Tracheostomy tube tip projects 5 cm above the carina.  Decreased diffuse interstitial and alveolar airspace opacities.   Electronically Signed   By: Awilda Metroourtnay  Bloomer   On: 002-26-2015 17:48     ASSESSMENT / PLAN:  PULMONARY A: Chronic respiratory failure, trach dependent Recent HCAP, previously treated - CXR 7/31 with inc b/l interstitial edema vs PNA P:   Monitor in ICU. Full vent support, 8cc/kg, RR 15. Repeat CXR in AM. ABG now.  CARDIOVASCULAR A: High risk for shock (hemorrhagic / hypovolemic, and possible septic) - less likely, stable vitals, no pressors HTN, pre-admit Lactate normal - 1.4 P:  Monitor BP, MAP goal >65, currently pressure stable Hold home BP meds - Hydralazine, Minoxidil  RENAL A:  AKI, suspected pre-renal azotemia, in setting of worsening CKD - Improved Cr to 3.93, elevated BUN >160 Hyperkalemia - Improved to 5.3, s/p kayexalate P:   IVF rehydration as tolerated Monitor BMET Called renal for consultation, likely a poor dialysis candidate.  GASTROINTESTINAL A: Acute GI Bleed, with melena - FOBT (positive), no active bleeding PEG dependent GERD P:   Consulted GI ([facility]) will follow for potential EGD vs further eval today Cont Protonix gtt Transfuse PRBC, IVF Monitor for re-bleed Once cleared by GI will start TF.  HEMATOLOGIC A: Acute Blood Loss Anemia, s/p 3u PRBC - Improved Hgb  to 7.3 Acute on chronic anemia P:  Monitor CBC q 8 hr VTE prophylaxis - SCDs  INFECTIOUS A: Recently treated for HCAP, unlikely infection - Inc b/l interstitial opacity on CXR 7/31, dec WBC, afebrile P:   Hold off abx for now. If febrile or WBC increases then will reconsider starting broad spectrum abx.  ENDOCRINE A: DM, Type 2 Hyperglycemia P:   Resistant SSI CBG monitoring q 4 hr Hold Lantus while NPO  NEUROLOGIC A: Persistent vegetative state, s/p ICH Hx Seizures P:   Resume Keppra today once resuming PO  Saralyn PilarAlexander Karamalegos, DO [facility], PGY-2  GLOBAL: Code Status - FULL, reviewed chart and previous hospitalization. Attempted to call family to discuss code status, unable to reach - Palliative consult (previously established and prior discussions with family), will update family will reconsider pending course of action.  TODAY'S SUMMARY: Remains in ICU for vent support with chronic trach in setting of  persistent veg state, GIB seems to be resolved with stable Hgb (s/p 3u PRBC), GI to see today (possible EGD or further w/u), cont monitor CBC.  CC time 35 min.  Alyson Reedy, M.D. [facility] Pulmonary/Critical Care Medicine. Pager: [phone]. After hours pager: [phone].  12/29/2013, 6:41 AM

## 2013-12-29 NOTE — Consult Note (Signed)
[facility] Consult: 9:04 AM 12/29/2013  LOS: 1 day    Referring Provider: Dr. Kendrick Fries Primary Care Physician:  Pcp Not In System Primary Gastroenterologist:  Not known.    Reason for Consultation:  Acute GI bleed with melana   HPI: Anneliese Leblond is a 58 y.o. female in a persistent vegetative state s/p ICH in 01/2013 which was complicated by respiratory failure. She was transferred from [facility] with anemia requiring transfusion with a hx of the same.  She has been transfused three units and her hgb has risen from 4.6 on 12/23/2013 to 7.4 today.  Nursing staff states pt had an episode of black tarry stools just prior to my arrival.  Pt does not respond to commands or eye open to voice during my examination.  An attempt was made to contact pts daughter at [phone] with no answer.    Past Medical History  Diagnosis Date  . Chronic respiratory failure   . Tracheostomy status   . Persist vegetative state   . 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 Type II diabetes mellitus   . HTN (hypertension)   . Anemia, unspecified   . CKD (chronic kidney disease) stage 2, GFR 60-89 ml/min   . Corneal abscess     history of   . Gastro-esophageal reflux   . Chronic constipation   . 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 Peg tube placement      unknown insertion date   . Tracheostomy      unknown insertion site    Prior to Admission medications   Medication Sig Start Date End Date Taking? Authorizing Provider  acetaminophen (TYLENOL) 160 MG/5ML liquid Place 650 mg into feeding tube every 6 (six) hours as needed for fever.   Yes Historical Provider, MD  atropine 1 % ophthalmic solution Place 1 drop into both eyes 3 (three) times daily.   Yes  Historical Provider, MD  carboxymethylcellulose (REFRESH PLUS) 0.5 % SOLN Apply 2 drops to eye every 8 (eight) hours as needed (dry irritated eyes).   Yes Historical Provider, MD  chlorhexidine (PERIDEX) 0.12 % solution Use as directed 15 mLs in the mouth or throat 2 (two) times daily. For trach tube   Yes Historical Provider, MD  docusate (COLACE) 50 MG/5ML liquid Place 100 mg into feeding tube daily.   Yes Historical Provider, MD  ferrous sulfate 300 (60 FE) MG/5ML syrup Place 300 mg into feeding tube every 12 (twelve) hours.   Yes Historical Provider, MD  furosemide (LASIX) 10 MG/ML solution Place 40 mg into feeding tube every morning.    Yes Historical Provider, MD  guaiFENesin (ROBITUSSIN) 100 MG/5ML liquid Place 200 mg into feeding tube every 4 (four) hours as needed (thick secretions).   Yes Historical Provider, MD  hydrALAZINE (APRESOLINE) 100 MG tablet Give 50 mg by tube 3 (three) times daily.    Yes Historical Provider, MD  insulin glargine (LANTUS)  100 UNIT/ML injection Inject 16 Units into the skin every morning.   Yes Historical Provider, MD  insulin regular (NOVOLIN R,HUMULIN R) 100 units/mL injection Inject 0-5 Units into the skin 3 (three) times daily before meals. 0-150= 0 units 151-200= 1 unit 201-250= 2 units 251-300= 3 units 301-350= 4 units 351-400=5 units >400 call physician   Yes Historical Provider, MD  lansoprazole (PREVACID SOLUTAB) 30 MG disintegrating tablet Place 30 mg into feeding tube every morning.    Yes Historical Provider, MD  levETIRAcetam (KEPPRA) 100 MG/ML solution Place 500 mg into feeding tube every 12 (twelve) hours.   Yes Historical Provider, MD  Lidocaine 0.5 % AERO Take 2 mLs by nebulization every 4 (four) hours as needed (cough related to Chronic Respiratory Failure).   Yes Historical Provider, MD  minoxidil (LONITEN) 2.5 MG tablet Place 2.5 mg into feeding tube daily.   Yes Historical Provider, MD  Nutritional Supplements (FEEDING SUPPLEMENT, VITAL  HIGH PROTEIN,) LIQD liquid 82ml/hr 12/21/13  Yes Bernadene Person, NP  polyethylene glycol (MIRALAX / GLYCOLAX) packet Place 17 g into feeding tube every 12 (twelve) hours as needed for moderate constipation.    Yes Historical Provider, MD  scopolamine (TRANSDERM-SCOP) 1 MG/3DAYS Place 1 patch onto the skin every 3 (three) days.   Yes Historical Provider, MD  sennosides (SENOKOT) 8.8 MG/5ML syrup Place 10 mLs into feeding tube every morning.    Yes Historical Provider, MD  ceFEPIme 1 g in dextrose 5 % 50 mL Inject 1 g into the vein daily. 12/21/13   Bernadene Person, NP  vancomycin 1,000 mg in sodium chloride 0.9 % 250 mL Inject 1,000 mg into the vein every 12 (twelve) hours.    Historical Provider, MD    Scheduled Meds: . sodium chloride   Intravenous Once  . antiseptic oral rinse  7 mL Mouth Rinse QID  . chlorhexidine  15 mL Mouth Rinse BID  . insulin aspart  0-20 Units Subcutaneous 6 times per day  . [START ON 01/01/2014] pantoprazole (PROTONIX) IV  40 mg Intravenous Q12H  . sodium chloride  10-40 mL Intracatheter Q12H   Infusions: . sodium chloride 10 mL/hr (12/29/13 0851)  . pantoprozole (PROTONIX) infusion 8 mg/hr (12/29/13 0845)   PRN Meds: sodium chloride, sodium chloride   Allergies as of 12/27/2013 - Review Complete 12/21/2013  Allergen Reaction Noted  . Calcium channel blockers Other (See Comments) 12/19/2013  . Haldol [haloperidol lactate] Other (See Comments) 12/19/2013  . Hctz [hydrochlorothiazide] Other (See Comments) 12/19/2013  . Lopressor [metoprolol tartrate] Other (See Comments) 12/19/2013    No family history on file.  History   Social History  . Marital Status: Single    Spouse Name: N/A    Number of Children: N/A  . Years of Education: N/A            REVIEW OF SYSTEMS: Unobtainable due to persistent vegetative state. Black tarry stools per Nursing staff.  PHYSICAL EXAM: Vital signs in last 24 hours: Filed Vitals:   12/29/13 0845  BP:  136/62  Pulse: 81  Temp:   Resp: 32   Wt Readings from Last 3 Encounters:  12/29/13 187 lb 9.8 oz (85.1 kg)  12/20/13 199 lb 4.7 oz (90.4 kg)    General: Obese AA female asleep in bed. HEENT: Chronic trach Lungs:  CTA bilaterally.  Decreased in lung bases.  No wheezing rales or rhonchi appreciated.  Heart: RRR, No MGR noted Abdomen: PEG tube, aspiration of contents was negligible, but no  obvious blood. Obese. Soft, NT, ND.  +BS.  Musc/Skeltl: No gross abnormalities Extremities: SCDs in place. 2+ peripheral edema to UEs.  2+ DP and radial pulses. Neurologic:  Vegetative state Skin:  Without rash Psych:  Vegetative state.  Intake/Output from previous day: 07/30 0701 - 07/31 0700 In: 2901.3 [I.V.:2031.3; Blood:870] Out: 105 [Urine:105] Intake/Output this shift:    LAB RESULTS:  Recent Labs  April 19, 2014 1713 April 19, 2014 1806 12/29/13 0400 12/29/13 0759  WBC 14.0*  --  12.1* 13.5*  HGB 4.6* 5.8* 7.3* 7.4*  HCT 14.3* 17.0* 22.2* 22.8*  PLT 245  --  204 219   BMET Lab Results  Component Value Date   NA 142 12/29/2013   NA 140 12/29/2013   NA 137 10/23/2013   K 5.3 12/29/2013   K 6.1* 12/29/2013   K 6.3* 10/23/2013   CL 102 12/29/2013   CL 101 12/29/2013   CL 105 10/23/2013   CO2 19 12/29/2013   CO2 18* 12/29/2013   CO2 16* 10/23/2013   GLUCOSE 192* 12/29/2013   GLUCOSE 300* 12/29/2013   GLUCOSE 325* 10/23/2013   BUN 165* 12/29/2013   BUN 171* 12/29/2013   BUN >140* 10/23/2013   CREATININE 3.93* 12/29/2013   CREATININE 3.86* 12/29/2013   CREATININE 4.80* 10/23/2013   CALCIUM 8.4 12/29/2013   CALCIUM 8.7 12/29/2013   CALCIUM 8.4 10/23/2013   LFT  Recent Labs  April 19, 2014 1713  PROT 7.9  ALBUMIN 2.0*  AST 68*  ALT 42*  ALKPHOS 339*  BILITOT 1.1   PT/INR Lab Results  Component Value Date   INR 1.66* 10/23/2013   INR 1.31 12/21/2013   INR 1.63* 12/19/2013   Iron/TIBC/Ferritin/ %Sat    Component Value Date/Time   IRON 20* 12/19/2013 1710   TIBC 191* 12/19/2013 1710    FERRITIN 478* 12/19/2013 1710   IRONPCTSAT 10* 12/19/2013 1710   RADIOLOGY STUDIES: Dg Chest Port 1 View  12/29/2013   CLINICAL DATA:  Possible pneumonia  EXAM: PORTABLE CHEST - 1 VIEW  COMPARISON:  005/25/2015  FINDINGS: Tracheostomy tube is unchanged in position. Persistent diffuse bilateral alveolar and airspace opacities. Findings may be due to bilateral pulmonary edema or bilateral pneumonia. No pneumothorax.  IMPRESSION: No significant change. Diffuse bilateral airspace disease and interstitial prominence again noted. Findings may be due to bilateral pulmonary edema or bilateral pneumonia.   Electronically Signed   By: Natasha MeadLiviu  Pop M.D.   On: 12/29/2013 07:59   Dg Chest Portable 1 View  10/23/2013   CLINICAL DATA:  Verify PICC placement.  EXAM: PORTABLE CHEST - 1 VIEW  COMPARISON:  Chest radiograph December 20, 2013  FINDINGS: Right PICC tip projects in proximal superior vena cava both, no apparent change in position. Tracheostomy tube tip projects 5 cm above the carina.  Cardiomediastinal silhouette is nonsuspicious. Diffuse interstitial and to lesser extent alveolar airspace opacities are decreased. Mildly elevated right hemidiaphragm. No pleural effusions. No pneumothorax though, lung apices incompletely imaged. Soft tissue planes and included osseous structures are unchanged.  IMPRESSION: Right PICC distal tip projects in proximal superior vena cava, no apparent change in position. Tracheostomy tube tip projects 5 cm above the carina.  Decreased diffuse interstitial and alveolar airspace opacities.   Electronically Signed   By: Awilda Metroourtnay  Bloomer   On: 005/25/2015 17:48    ENDOSCOPIC STUDIES: Unknown.  Awaiting call back from family.  IMPRESSION:   - Acute GI bleed with melana - requiring transfusion.  - Normocytic Anemia - Acute blood loss. Improving after 3  units. Iron studies on 12/19/13 showed low iron, but with low TIBC and high ferritin.     PLAN:     - Awaiting call back or family  visitation to obtain better information about GI history. - May benefit from repeat iron studies. - With lack of endoscopic history, may need EGD and/or colonoscopy for evaluation of GI bleed.   Doreatha Martin  12/29/2013, 9:04 AM Florence Canner, PA-Student [place] Pager: [phone]  I have personally seen the patient, reviewed and repeated key elements of the history and physical and participated in formation of the assessment and plan the student has documented.  She apparently had a GI bleed - upper suspected.  EGD could be reasonable. Family en rout from [place] per RN.  Will ask them to call me/consent for EGD in case we need to do so.  Iva Boop, MD, [facility] [phone] (pager) 12/29/2013 4:41 PM

## 2013-12-29 NOTE — Progress Notes (Signed)
Hypoglycemic Event  CBG: 57  Treatment: D50 IV 50 mL  Symptoms: None  Follow-up CBG: Time:1230 CBG Result:113  Possible Reasons for Event: Inadequate meal intake  Comments/MD notified: Dr. Jamie BrookesYacoub    [name], Bonnie L  Remember to initiate Hypoglycemia Order Set & complete

## 2013-12-29 NOTE — Progress Notes (Signed)
Results for Su GrandCADE, Jeanifer (MRN [id number]) as of 12/29/2013 14:15  Ref. Range 12/29/2013 00:26 12/29/2013 04:22 12/29/2013 08:14 12/29/2013 12:00 12/29/2013 12:38  Glucose-Capillary Latest Range: 70-99 mg/dL 782274 (H) 956175 (H) 97 57 (L) 113 (H)  Had low blood sugar at 12 noon today.  Recommend decreasing Novolog correction scale to MODERATE every 4 hours.  Will follow in hospital.  Smith MinceKendra Graysin Luczynski RN BSN CDE

## 2013-12-29 NOTE — ED Provider Notes (Signed)
I saw and evaluated the patient, reviewed the resident's note and I agree with the findings and plan.   EKG Interpretation   Date/Time:  Thursday December 28 2013 17:25:23 EDT Ventricular Rate:  92 PR Interval:  152 QRS Duration: 85 QT Interval:  333 QTC Calculation: 412 R Axis:   67 Text Interpretation:  Sinus rhythm Low voltage, precordial leads No  previous ECGs available Confirmed by YAO  MD, DAVID ([id number]) on 12/27/2013  5:32:56 PM      [name] is a 58 y.o. female hx of DM, HTN, chronically ill from nursing home here with anemia. Had blood drawn at nursing home and was noted to anemic to 4.5. She was reported to have black stools as well. Moreover, she has trach from previous intracranial hemorrhage and is current on a vent. On exam, chronically ill appearing. Hypothermic, hypoxic. Crackles bilateral lung bases. Cardiac exam unremarkable. Peg in place. Has PICC line R arm. Labs significant for Hg 4.6, K 6.5, Cr 3.9. Given meds for hyperkalemia, given IVF for acute renal failure. Also has pneumonia on xray. Given abx for HCAP. Transfusion ordered. Will admit to ICU given vent requirement.   CRITICAL CARE Performed by: Silverio LayYAO, DAVID   Total critical care time: 30 min   Critical care time was exclusive of separately billable procedures and treating other patients.  Critical care was necessary to treat or prevent imminent or life-threatening deterioration.  Critical care was time spent personally by me on the following activities: development of treatment plan with patient and/or surrogate as well as nursing, discussions with consultants, evaluation of patient's response to treatment, examination of patient, obtaining history from patient or surrogate, ordering and performing treatments and interventions, ordering and review of laboratory studies, ordering and review of radiographic studies, pulse oximetry and re-evaluation of patient's condition.    Level V caveat- condition of patient     Bonnie Canalavid H Yao, MD 12/29/13 1504

## 2013-12-30 ENCOUNTER — Encounter (HOSPITAL_COMMUNITY): Payer: Self-pay | Admitting: *Deleted

## 2013-12-30 ENCOUNTER — Inpatient Hospital Stay (HOSPITAL_COMMUNITY): Payer: Medicare Other

## 2013-12-30 ENCOUNTER — Encounter (HOSPITAL_COMMUNITY): Admission: EM | Disposition: E | Payer: Self-pay | Source: Home / Self Care | Attending: Pulmonary Disease

## 2013-12-30 DIAGNOSIS — K921 Melena: Secondary | ICD-10-CM | POA: Diagnosis not present

## 2013-12-30 DIAGNOSIS — K922 Gastrointestinal hemorrhage, unspecified: Secondary | ICD-10-CM | POA: Diagnosis not present

## 2013-12-30 HISTORY — PX: ESOPHAGOGASTRODUODENOSCOPY: SHX5428

## 2013-12-30 LAB — TYPE AND SCREEN
ABO/RH(D): O POS
ANTIBODY SCREEN: NEGATIVE
UNIT DIVISION: 0
Unit division: 0
Unit division: 0

## 2013-12-30 LAB — PHOSPHORUS: Phosphorus: 6.8 mg/dL — ABNORMAL HIGH (ref 2.3–4.6)

## 2013-12-30 LAB — GLUCOSE, CAPILLARY
GLUCOSE-CAPILLARY: 128 mg/dL — AB (ref 70–99)
Glucose-Capillary: 133 mg/dL — ABNORMAL HIGH (ref 70–99)
Glucose-Capillary: 123 mg/dL — ABNORMAL HIGH (ref 70–99)
Glucose-Capillary: 146 mg/dL — ABNORMAL HIGH (ref 70–99)
Glucose-Capillary: 166 mg/dL — ABNORMAL HIGH (ref 70–99)
Glucose-Capillary: 167 mg/dL — ABNORMAL HIGH (ref 70–99)
Glucose-Capillary: 182 mg/dL — ABNORMAL HIGH (ref 70–99)

## 2013-12-30 LAB — BLOOD GAS, ARTERIAL
Acid-base deficit: 4.7 mmol/L — ABNORMAL HIGH (ref 0.0–2.0)
Bicarbonate: 20.9 mEq/L (ref 20.0–24.0)
DRAWN BY: 41877
FIO2: 0.8 %
LHR: 16 {breaths}/min
O2 Saturation: 92.4 %
PCO2 ART: 43.8 mmHg (ref 35.0–45.0)
PEEP/CPAP: 10 cmH2O
Patient temperature: 97.6
TCO2: 22.2 mmol/L (ref 0–100)
VT: 450 mL
pH, Arterial: 7.296 — ABNORMAL LOW (ref 7.350–7.450)
pO2, Arterial: 67.6 mmHg — ABNORMAL LOW (ref 80.0–100.0)

## 2013-12-30 LAB — BASIC METABOLIC PANEL
ANION GAP: 22 — AB (ref 5–15)
BUN: 166 mg/dL — ABNORMAL HIGH (ref 6–23)
CALCIUM: 8.2 mg/dL — AB (ref 8.4–10.5)
CHLORIDE: 99 meq/L (ref 96–112)
CO2: 21 mEq/L (ref 19–32)
CREATININE: 4.35 mg/dL — AB (ref 0.50–1.10)
GFR calc Af Amer: 12 mL/min — ABNORMAL LOW (ref >90)
GFR, EST NON AFRICAN AMERICAN: 10 mL/min — AB (ref >90)
Glucose, Bld: 125 mg/dL — ABNORMAL HIGH (ref 70–99)
Potassium: 4.5 mEq/L (ref 3.7–5.3)
Sodium: 142 mEq/L (ref 137–147)

## 2013-12-30 LAB — CBC
HCT: 21.8 % — ABNORMAL LOW (ref 36.0–46.0)
HCT: 21.4 % — ABNORMAL LOW (ref 36.0–46.0)
HEMOGLOBIN: 7.2 g/dL — AB (ref 12.0–15.0)
Hemoglobin: 7.1 g/dL — ABNORMAL LOW (ref 12.0–15.0)
MCH: 28.8 pg (ref 26.0–34.0)
MCH: 29.1 pg (ref 26.0–34.0)
MCHC: 33 g/dL (ref 30.0–36.0)
MCHC: 33.2 g/dL (ref 30.0–36.0)
MCV: 87.2 fL (ref 78.0–100.0)
MCV: 87.7 fL (ref 78.0–100.0)
Platelets: 205 10*3/uL (ref 150–400)
Platelets: 222 10*3/uL (ref 150–400)
RBC: 2.5 MIL/uL — ABNORMAL LOW (ref 3.87–5.11)
RBC: 2.44 MIL/uL — AB (ref 3.87–5.11)
RDW: 19.1 % — AB (ref 11.5–15.5)
RDW: 19.2 % — AB (ref 11.5–15.5)
WBC: 12.7 10*3/uL — ABNORMAL HIGH (ref 4.0–10.5)
WBC: 14.5 10*3/uL — ABNORMAL HIGH (ref 4.0–10.5)

## 2013-12-30 LAB — MAGNESIUM: Magnesium: 2.9 mg/dL — ABNORMAL HIGH (ref 1.5–2.5)

## 2013-12-30 LAB — PROCALCITONIN
PROCALCITONIN: 4.25 ng/mL
Procalcitonin: 4.16 ng/mL

## 2013-12-30 SURGERY — EGD (ESOPHAGOGASTRODUODENOSCOPY)
Anesthesia: Moderate Sedation

## 2013-12-30 MED ORDER — PIPERACILLIN-TAZOBACTAM IN DEX 2-0.25 GM/50ML IV SOLN
2.2500 g | Freq: Four times a day (QID) | INTRAVENOUS | Status: DC
  Administered 2013-12-30 – 2014-01-08 (×37): 2.25 g via INTRAVENOUS
  Filled 2013-12-30 (×40): qty 50

## 2013-12-30 MED ORDER — SODIUM CHLORIDE 0.9 % IV SOLN: INTRAVENOUS | Status: DC

## 2013-12-30 MED ORDER — MIDAZOLAM HCL 10 MG/2ML IJ SOLN
INTRAMUSCULAR | Status: DC | PRN
  Administered 2013-12-30: 2 mg via INTRAVENOUS

## 2013-12-30 MED ORDER — MIDAZOLAM HCL 2 MG/2ML IJ SOLN
INTRAMUSCULAR | Status: AC
  Filled 2013-12-30: qty 10

## 2013-12-30 NOTE — Progress Notes (Signed)
ANTIBIOTIC CONSULT NOTE - INITIAL  Pharmacy Consult for zosyn Indication: HCAP  Allergies  Allergen Reactions  . Calcium Channel Blockers Other (See Comments)    unkown  . Haldol [Haloperidol Lactate] Other (See Comments)    unknown  . Hctz [Hydrochlorothiazide] Other (See Comments)    unknown  . Lopressor [Metoprolol Tartrate] Other (See Comments)    unknown    Patient Measurements: Height: 5' 1.81" (157 cm) Weight: 202 lb 2.6 oz (91.7 kg) IBW/kg (Calculated) : 49.67  Vital Signs: Temp: 97.8 F (36.6 C) (08/01 1145) Temp src: Oral (08/01 0757) BP: 159/53 mmHg (08/01 1210) Pulse Rate: 89 (08/01 1210) Intake/Output from previous day: 07/31 0701 - 08/01 0700 In: 2537.3 [I.V.:2537.3] Out: 25 [Urine:25] Intake/Output from this shift: Total I/O In: 610 [I.V.:560; Other:50] Out: -   Labs:  Recent Labs  12/29/13 12/29/13 0400  12/29/13 1159 12/29/13 1500 November 03, 2013 0340  WBC  --  12.1*  < > 12.9* 14.0* 12.7*  HGB  --  7.3*  < > 7.4* 7.0* 7.2*  PLT  --  204  < > 208 208 205  CREATININE 3.86* 3.93*  --   --   --  4.35*  < > = values in this interval not displayed. Estimated Creatinine Clearance: 14.8 ml/min (by C-G formula based on Cr of 4.35).  Recent Labs  12/29/13 1200  VANCORANDOM 63.8*     Microbiology: Recent Results (from the past 720 hour(s))  URINE CULTURE     Status: None   Collection Time    12/19/13  3:43 PM      Result Value Ref Range Status   Specimen Description URINE, CATHETERIZED   Final   Special Requests NONE   Final   Culture  Setup Time     Final   Value: 12/19/2013 17:50     Performed at [facility]   Colony Count     Final   Value: >=100,000 COLONIES/ML     Performed at [facility]   Culture     Final   Value: Multiple bacterial morphotypes present, none predominant. Suggest appropriate recollection if clinically indicated.     Performed at [facility]   Report Status 12/20/2013 FINAL   Final  CULTURE,  RESPIRATORY (NON-EXPECTORATED)     Status: None   Collection Time    12/19/13  3:44 PM      Result Value Ref Range Status   Specimen Description TRACHEAL ASPIRATE   Final   Special Requests NONE   Final   Gram Stain     Final   Value: RARE WBC PRESENT, PREDOMINANTLY MONONUCLEAR     RARE SQUAMOUS EPITHELIAL CELLS PRESENT     FEW GRAM NEGATIVE RODS     RARE GRAM POSITIVE RODS     RARE GRAM POSITIVE COCCI IN PAIRS   Culture     Final   Value: Non-Pathogenic Oropharyngeal-type Flora Isolated.     Performed at [facility]   Report Status 12/22/2013 FINAL   Final  MRSA PCR SCREENING     Status: None   Collection Time    12/19/13  3:50 PM      Result Value Ref Range Status   MRSA by PCR NEGATIVE  NEGATIVE Final   Comment:            The GeneXpert MRSA Assay (FDA     approved for NASAL specimens     only), is one component of a     comprehensive  MRSA colonization     surveillance program. It is not     intended to diagnose MRSA     infection nor to guide or     monitor treatment for     MRSA infections.  CULTURE, BLOOD (ROUTINE X 2)     Status: None   Collection Time    12/19/13  5:10 PM      Result Value Ref Range Status   Specimen Description BLOOD LEFT HAND   Final   Special Requests BOTTLES DRAWN AEROBIC ONLY 3.5CC   Final   Culture  Setup Time     Final   Value: 12/19/2013 22:19     Performed at [facility]   Culture     Final   Value: NO GROWTH 5 DAYS     Performed at [facility]   Report Status 12/25/2013 FINAL   Final  CULTURE, BLOOD (ROUTINE X 2)     Status: None   Collection Time    12/19/13  5:15 PM      Result Value Ref Range Status   Specimen Description BLOOD ARM LEFT   Final   Special Requests BOTTLES DRAWN AEROBIC ONLY 6CC   Final   Culture  Setup Time     Final   Value: 12/19/2013 22:20     Performed at [facility]   Culture     Final   Value: NO GROWTH 5 DAYS     Performed at [facility]   Report Status  12/25/2013 FINAL   Final  CULTURE, RESPIRATORY (NON-EXPECTORATED)     Status: None   Collection Time    Jan 08, 2014  6:00 PM      Result Value Ref Range Status   Specimen Description TRACHEAL ASPIRATE   Final   Special Requests Normal   Final   Gram Stain     Final   Value: ABUNDANT WBC PRESENT,BOTH PMN AND MONONUCLEAR     RARE SQUAMOUS EPITHELIAL CELLS PRESENT     MODERATE GRAM NEGATIVE RODS     Performed at [facility]     Final   Value: Culture reincubated for better growth     Performed at [facility]   Report Status PENDING   Incomplete    Medical History: Past Medical History  Diagnosis Date  . Chronic respiratory failure   . Tracheostomy status   . Persist vegetative state   . 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 Type II diabetes mellitus   . HTN (hypertension)   . Anemia, unspecified   . CKD (chronic kidney disease) stage 2, GFR 60-89 ml/min   . Corneal abscess     history of   . Gastro-esophageal reflux   . Chronic constipation   . 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 Patient was given 1x doses of vancomycin and zosyn in ED, new orders to continue zosyn for empiric coverage. Hypothermic this am 94.3, wbc down 12.7, pct 4.25. Patient currently in renal failure with scr of 4.3 will adjust abx accordingly.  CULTURES:  Blood x2 7/30 >>>  Respiratory 7/30 >>>GNR >>  Goal of Therapy:  Resolution of infection  Plan:  Continue zosyn at 2.25g IV q 6 hours - infusions Follow up need for the addition of vancomycin  Sheppard Coil PharmD., BCPS Clinical Pharmacist Pager [phone] 01/19/2014 12:16 PM

## 2013-12-30 NOTE — Progress Notes (Signed)
PULMONARY / CRITICAL CARE MEDICINE   Name: [name] MRN: [id number] DOB: 06-20-1955    ADMISSION DATE:  12/27/2013 CONSULTATION DATE:  12/25/2013  REFERRING MD :  [facility] >> EDP  CHIEF COMPLAINT:  Acute anemia, GIB  INITIAL PRESENTATION: 58 yo trach dependent female with recent ICH 01/2013 complicated by respiratory failure. 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 She was transfered from [facility] with anemia 4.6 requiring transfusion & AKI  STUDIES:  7/30 - FOBT (Positive)  SIGNIFICANT EVENTS: 01/2014 - ICH > resulting in chronic resp failure with trach status.  7/21 > 7/23 - Admission for anemia requiring transfusion 7/30 - admitted for anemia Hgb 4.6, transfuse PRBC, consulted GI, HyperK to 6.5, worsening AKI 7/31 - Hgb improved to 7.3 (s/p 3u PRBC), K down to 5.3 (s/p kayexalate), Lactic acid 1.4, PCT stable 4.15         - Remains on vent support, resolved bleeding, CXR (inc b/l interstitial opacities edema vs PNA)  ANTIBIOTICS: Vancomycin 7/30 x 1 dose Zosyn 7/30 x 1 dose  CULTURES: Blood x2 7/30 >>> Respiratory 7/30 >>>GNR >>  LINES/TUBES: Tracheostomy, chronic PIVs  SUBJECTIVE:  Needs higher FIO2 Afebrile No family at bedside this AM.  VITAL SIGNS: Temp:  [94.3 F (34.6 C)-98 F (36.7 C)] 98 F (36.7 C) (08/01 0757) Pulse Rate:  [64-82] 80 (08/01 1056) Resp:  [0-37] 24 (08/01 1056) BP: (105-127)/(49-72) 113/52 mmHg (08/01 1056) SpO2:  [91 %-100 %] 92 % (08/01 1056) FiO2 (%):  [70 %-80 %] 80 % (08/01 1056) Weight:  [91.7 kg (202 lb 2.6 oz)] 91.7 kg (202 lb 2.6 oz) (08/01 0408) HEMODYNAMICS:   VENTILATOR SETTINGS: Vent Mode:  [-] PRVC FiO2 (%):  [70 %-80 %] 80 % Set Rate:  [16 bmp] 16 bmp Vt Set:  [450 mL] 450 mL PEEP:  [10 cmH20] 10 cmH20 Plateau Pressure:  [15 cmH20-31 cmH20] 28 cmH20 INTAKE / OUTPUT:  Intake/Output Summary (Last 24 hours) at 01/07/2014 1110 Last data filed at 01/28/2014 0800  Gross per 24 hour  Intake 2470.83 ml   Output     20 ml  Net 2450.83 ml    PHYSICAL EXAMINATION: General:  Obese, vegetative state, NAD Neuro:  Awake, Non-purposeful facial / eye movements, some withdrawal to pain HEENT:  Chronic trach on vent Cardiovascular:  RRR, no murmurs Lungs:  Persistent bilateral coarse breath sounds, no wheezing, no focal crackles. Abdomen:  Obese, soft, non-distended, +active BS in all quads, PEG tube Musculoskeletal: persistent b/l +1 pitting edema lower and upper ext Skin:  Warm, sacral decub with dressing  LABS:  CBC  Recent Labs Lab 12/29/13 1159 12/29/13 1500 01/10/2014 0340  WBC 12.9* 14.0* 12.7*  HGB 7.4* 7.0* 7.2*  HCT 23.0* 21.2* 21.8*  PLT 208 208 205   Coag's  Recent Labs Lab 12/10/2013 1713  APTT 43*  INR 1.66*   BMET  Recent Labs Lab 12/29/13 12/29/13 0400 01/18/2014 0340  NA 140 142 142  K 6.1* 5.3 4.5  CL 101 102 99  CO2 18* 19 21  BUN 171* 165* 166*  CREATININE 3.86* 3.93* 4.35*  GLUCOSE 300* 192* 125*   Electrolytes  Recent Labs Lab 12/29/13 12/29/13 0400 01/09/2014 0340  CALCIUM 8.7 8.4 8.2*  MG  --   --  2.9*  PHOS  --   --  6.8*   Sepsis Markers  Recent Labs Lab 12/16/2013 1807 12/11/2013 2003 12/29/13 0400 01/28/2014 0340  LATICACIDVEN 1.27  --  1.4  --  PROCALCITON  --  4.25 4.15 4.25  4.16   ABG  Recent Labs Lab 12/29/13 1232 12/29/13 1742 01/06/2014 0516  PHART 7.252* 7.280* 7.296*  PCO2ART 45.5* 45.1* 43.8  PO2ART 57.0* 170.0* 67.6*   Liver Enzymes  Recent Labs Lab 01-21-14 1713  AST 68*  ALT 42*  ALKPHOS 339*  BILITOT 1.1  ALBUMIN 2.0*   Cardiac Enzymes No results found for this basename: TROPONINI, PROBNP,  in the last 168 hours Glucose  Recent Labs Lab 12/29/13 1238 12/29/13 1633 12/29/13 1939 01/26/2014 0040 01/05/2014 0358 01/15/2014 0748  GLUCAP 113* 78 98 128* 133* 123*    Imaging Dg Chest Port 1 View  12/29/2013   CLINICAL DATA:  Possible pneumonia  EXAM: PORTABLE CHEST - 1 VIEW  COMPARISON:  01-21-14   FINDINGS: Tracheostomy tube is unchanged in position. Persistent diffuse bilateral alveolar and airspace opacities. Findings may be due to bilateral pulmonary edema or bilateral pneumonia. No pneumothorax.  IMPRESSION: No significant change. Diffuse bilateral airspace disease and interstitial prominence again noted. Findings may be due to bilateral pulmonary edema or bilateral pneumonia.   Electronically Signed   By: Natasha Mead M.D.   On: 12/29/2013 07:59     ASSESSMENT / PLAN:  PULMONARY A: Chronic respiratory failure, trach dependent Recent HCAP, previously treated - CXR 7/31 with inc b/l interstitial edema vs PNA P:   Full vent support, 8cc/kg, RR 15. Will add PEEP if unable to get FIO2 down   CARDIOVASCULAR A: HTN, pre-admit Lactate normal - 1.4 P:  Monitor BP, MAP goal >65, currently  stable Hold home BP meds - Hydralazine, Minoxidil  RENAL A:  AKI, suspected pre-renal azotemia, in setting of worsening CKD & UGIB - Improved Cr to 3.93, elevated BUN >160 Hyperkalemia - Improved to 5.3, s/p kayexalate P:   Bicarb gtt Monitor BMET Agree with  Renal - poor dialysis candidate.  GASTROINTESTINAL A: Acute GI Bleed, with melena - FOBT (positive), no active bleeding PEG dependent GERD P:   Consulted GI ([facility]) - EGD being planned Cont Protonix gtt x 48 h total  Monitor for re-bleed Once cleared by GI will start TF.  HEMATOLOGIC A: Acute Blood Loss Anemia, s/p 3u PRBC - Improved Hgb to 7.3 Acute on chronic anemia P:  Monitor CBC q 12 hr Transfuse PRBC for Hb <7 VTE prophylaxis - SCDs  INFECTIOUS A: Recently treated for HCAP, unlikely infection - Inc b/l interstitial opacity on CXR 7/31, dec WBC, afebrile P:   Resume  zosyn If febrile or WBC increases then will reconsider starting vanc  ENDOCRINE A: DM, Type 2 Hypoglycemia P:   Resistant SSI CBG monitoring q 4 hr, ct D10  Hold Lantus while NPO  NEUROLOGIC A: Persistent vegetative state, s/p ICH Hx  Seizures P:   Resume Keppra    GLOBAL: Code Status - FULL, Palliative consult (previously established and prior discussions with family)  TODAY'S SUMMARY: Remains in ICU for vent support with chronic trach in setting of persistent veg state, GIB seems to be resolved with stable Hgb (s/p 3u PRBC), EGD planned, watch renal function   CC time 32 min.  Cyril Mourning MD. Tonny Bollman. [facility] Pager [phone] If no response call [phone]    01/11/2014, 11:10 AM

## 2013-12-30 NOTE — Brief Op Note (Signed)
12/06/2013 - 01/12/2014  12:21 PM  PATIENT:  [name]  58 y.o. female  PRE-OPERATIVE DIAGNOSIS:  GI bleed  POST-OPERATIVE DIAGNOSIS:  normal  PROCEDURE:  Procedure(s): ESOPHAGOGASTRODUODENOSCOPY (EGD) (N/A)  SURGEON:  Surgeon(s) and Role:    * Iva Booparl E Delante Karapetyan, MD - Primary    ANESTHESIA:   IV sedation   Versed 2 mg  Normal EGD  No further evaluation planned Supportive care

## 2013-12-30 NOTE — Progress Notes (Signed)
Palliative Medicine Team Consult Received. Please see complete palliative consultation dated 12/20/2013. Recommendations were for Ethics Consult and to follow a conflict resolution process, but prior to this being done she was transferred back to [facility]. [facility], [facility]. This is a patient in a persistent vegetative state where family has elected for very aggressive care while waiting on a miracle. PMT will follow closely.  Bonnie MaltaElizabeth Meliss Fleek, DO Palliative Medicine

## 2013-12-30 NOTE — Progress Notes (Signed)
[facility] ROUNDING NOTE   Subjective:   Interval History: no changes to condition remains critically ill  Objective:  Vital signs in last 24 hours:  Temp:  [94.3 F (34.6 C)-98 F (36.7 C)] 97.8 F (36.6 C) (08/01 1145) Pulse Rate:  [64-81] 81 (08/01 1100) Resp:  [0-37] 25 (08/01 1100) BP: (105-127)/(49-72) 118/56 mmHg (08/01 1100) SpO2:  [90 %-100 %] 92 % (08/01 1100) FiO2 (%):  [80 %] 80 % (08/01 1056) Weight:  [91.7 kg (202 lb 2.6 oz)] 91.7 kg (202 lb 2.6 oz) (08/01 0408)  Weight change: 1.3 kg (2 lb 13.9 oz) Filed Weights   12/16/2013 2145 12/29/13 0500 01/09/2014 0408  Weight: 85.1 kg (187 lb 9.8 oz) 85.1 kg (187 lb 9.8 oz) 91.7 kg (202 lb 2.6 oz)    Intake/Output: I/O last 3 completed shifts: In: 5438.6 [I.V.:4568.6; Blood:870] Out: 130 [Urine:130]   Intake/Output this shift:  Total I/O In: 610 [I.V.:560; Other:50] Out: -   General: Obese, vegetative state  HEENT: Chronic trach on ventilator Cardiovascular: RRR Lungs: Persistent bilateral coarse breath sounds Abdomen: Obese, PEG tube  Edema diffuse Skin: Warm, sacral decub with dressing    Basic Metabolic Panel:  Recent Labs Lab 12/27/2013 1713 12/25/2013 1806 12/29/13 12/29/13 0400 09-Jan-2014 0340  NA 137 137 140 142 142  K 6.5* 6.3* 6.1* 5.3 4.5  CL 97 105 101 102 99  CO2 16*  --  18* 19 21  GLUCOSE 321* 325* 300* 192* 125*  BUN 174* >140* 171* 165* 166*  CREATININE 3.90* 4.80* 3.86* 3.93* 4.35*  CALCIUM 8.4  --  8.7 8.4 8.2*  MG  --   --   --   --  2.9*  PHOS  --   --   --   --  6.8*    Liver Function Tests:  Recent Labs Lab 12/06/2013 1713  AST 68*  ALT 42*  ALKPHOS 339*  BILITOT 1.1  PROT 7.9  ALBUMIN 2.0*   No results found for this basename: LIPASE, AMYLASE,  in the last 168 hours No results found for this basename: AMMONIA,  in the last 168 hours  CBC:  Recent Labs Lab 12/05/2013 1713  12/29/13 0400 12/29/13 0759 12/29/13 1159 12/29/13 1500 01/09/14 0340  WBC  14.0*  --  12.1* 13.5* 12.9* 14.0* 12.7*  NEUTROABS 12.6*  --   --   --   --   --   --   HGB 4.6*  < > 7.3* 7.4* 7.4* 7.0* 7.2*  HCT 14.3*  < > 22.2* 22.8* 23.0* 21.2* 21.8*  MCV 90.5  --  86.0 86.0 87.8 85.1 87.2  PLT 245  --  204 219 208 208 205  < > = values in this interval not displayed.  Cardiac Enzymes: No results found for this basename: CKTOTAL, CKMB, CKMBINDEX, TROPONINI,  in the last 168 hours  BNP: No components found with this basename: POCBNP,   CBG:  Recent Labs Lab 12/29/13 1939 09-Jan-2014 0040 01/09/2014 0358 January 09, 2014 0748 January 09, 2014 1131  GLUCAP 98 128* 133* 123* 146*    Microbiology: Results for orders placed during the hospital encounter of 12/18/2013  CULTURE, RESPIRATORY (NON-EXPECTORATED)     Status: None   Collection Time    11/29/2013  6:00 PM      Result Value Ref Range Status   Specimen Description TRACHEAL ASPIRATE   Final   Special Requests Normal   Final   Gram Stain     Final   Value: ABUNDANT  WBC PRESENT,BOTH PMN AND MONONUCLEAR     RARE SQUAMOUS EPITHELIAL CELLS PRESENT     MODERATE GRAM NEGATIVE RODS     Performed at [facility]   Culture     Final   Value: Culture reincubated for better growth     Performed at [facility]   Report Status PENDING   Incomplete    Coagulation Studies:  Recent Labs  11/29/2013 1713  LABPROT 19.6*  INR 1.66*    Urinalysis:  Recent Labs  12/29/13 0513 12/29/13 1244  COLORURINE AMBER* AMBER*  LABSPEC 1.027 1.026  PHURINE 5.0 5.5  GLUCOSEU NEGATIVE NEGATIVE  HGBUR MODERATE* LARGE*  BILIRUBINUR SMALL* SMALL*  KETONESUR NEGATIVE NEGATIVE  PROTEINUR >300* >300*  UROBILINOGEN 0.2 0.2  NITRITE NEGATIVE NEGATIVE  LEUKOCYTESUR MODERATE* LARGE*      Imaging: Dg Chest Port 1 View  01/27/2014   CLINICAL DATA:  Airspace disease, pulmonary edema versus infiltrates  EXAM: PORTABLE CHEST - 1 VIEW  COMPARISON:  Prior chest x-ray 12/29/2013  FINDINGS: Tracheostomy tube remains in stable  position. The tip is midline. Right upper extremity PICC. Catheter tip in good position in the distal SVC. Stable cardiac and mediastinal contours. Improved bilateral interstitial and airspace opacities. No definite effusion or pneumothorax. A small amount of fluid present within the minor fissure.  IMPRESSION: 1. Overall, improved aeration with decreasing airspace opacities most consistent with improving pulmonary edema. There are persistent diffuse background interstitial opacities with patchy areas of airspace opacity. 2. While the interval change over the last 24 hr is likely secondary to improving pulmonary edema, it remains difficult to exclude superimposed infection in the remaining airspace disease. 3. Stable and satisfactory support apparatus.   Electronically Signed   By: Malachy MoanHeath  McCullough M.D.   On: 12/31/2013 07:11   Dg Chest Port 1 View  12/29/2013   CLINICAL DATA:  Possible pneumonia  EXAM: PORTABLE CHEST - 1 VIEW  COMPARISON:  12/16/2013  FINDINGS: Tracheostomy tube is unchanged in position. Persistent diffuse bilateral alveolar and airspace opacities. Findings may be due to bilateral pulmonary edema or bilateral pneumonia. No pneumothorax.  IMPRESSION: No significant change. Diffuse bilateral airspace disease and interstitial prominence again noted. Findings may be due to bilateral pulmonary edema or bilateral pneumonia.   Electronically Signed   By: Natasha MeadLiviu  Pop M.D.   On: 12/29/2013 07:59   Dg Chest Portable 1 View  11/29/2013   CLINICAL DATA:  Verify PICC placement.  EXAM: PORTABLE CHEST - 1 VIEW  COMPARISON:  Chest radiograph December 20, 2013  FINDINGS: Right PICC tip projects in proximal superior vena cava both, no apparent change in position. Tracheostomy tube tip projects 5 cm above the carina.  Cardiomediastinal silhouette is nonsuspicious. Diffuse interstitial and to lesser extent alveolar airspace opacities are decreased. Mildly elevated right hemidiaphragm. No pleural effusions. No  pneumothorax though, lung apices incompletely imaged. Soft tissue planes and included osseous structures are unchanged.  IMPRESSION: Right PICC distal tip projects in proximal superior vena cava, no apparent change in position. Tracheostomy tube tip projects 5 cm above the carina.  Decreased diffuse interstitial and alveolar airspace opacities.   Electronically Signed   By: Awilda Metroourtnay  Bloomer   On: 12/17/2013 17:48     Medications:   . sodium chloride 10 mL/hr at 01/17/2014 1100  . sodium chloride    . dextrose 30 mL/hr at 01/01/2014 1100  . pantoprozole (PROTONIX) infusion 8 mg/hr (01/16/2014 1100)  .  sodium bicarbonate  infusion 1000 mL 75 mL/hr at  2014/01/22 1100   . sodium chloride   Intravenous Once  . antiseptic oral rinse  7 mL Mouth Rinse QID  . chlorhexidine  15 mL Mouth Rinse BID  . insulin aspart  0-20 Units Subcutaneous 6 times per day  . midazolam      . [START ON 01/01/2014] pantoprazole (PROTONIX) IV  40 mg Intravenous Q12H  . sodium chloride  10-40 mL Intracatheter Q12H   sodium chloride, fentaNYL, midazolam, sodium chloride  Assessment/ Plan:  This is a very sad situation of a young lady in a persistent vegitative state with recurrent GI bleed Her renal failure is most likely ischemic ATN versus volume depletion. She is making little urine. I do not think chronic dialysis is appropriate and should not be considered  Hyperkalemia resolved  Metabolic acidosis improved  HTN/volume stable  Will need to make some difficult decisions regarding continued supportive care     LOS: 2 Jackquline Branca W @TODAY @12 :03 PM

## 2013-12-30 DEATH — deceased

## 2013-12-31 ENCOUNTER — Inpatient Hospital Stay (HOSPITAL_COMMUNITY): Payer: Medicare Other

## 2013-12-31 DIAGNOSIS — K921 Melena: Secondary | ICD-10-CM | POA: Diagnosis not present

## 2013-12-31 DIAGNOSIS — K922 Gastrointestinal hemorrhage, unspecified: Secondary | ICD-10-CM | POA: Diagnosis not present

## 2013-12-31 DIAGNOSIS — N179 Acute kidney failure, unspecified: Secondary | ICD-10-CM

## 2013-12-31 DIAGNOSIS — N189 Chronic kidney disease, unspecified: Secondary | ICD-10-CM

## 2013-12-31 LAB — BASIC METABOLIC PANEL
ANION GAP: 27 — AB (ref 5–15)
Anion gap: 19 — ABNORMAL HIGH (ref 5–15)
BUN: 141 mg/dL — AB (ref 6–23)
BUN: 160 mg/dL — ABNORMAL HIGH (ref 6–23)
CALCIUM: 8 mg/dL — AB (ref 8.4–10.5)
CO2: 33 mEq/L — ABNORMAL HIGH (ref 19–32)
CO2: 21 mEq/L (ref 19–32)
CREATININE: 4.23 mg/dL — AB (ref 0.50–1.10)
Calcium: 7 mg/dL — ABNORMAL LOW (ref 8.4–10.5)
Chloride: 84 mEq/L — ABNORMAL LOW (ref 96–112)
Chloride: 92 mEq/L — ABNORMAL LOW (ref 96–112)
Creatinine, Ser: 4.73 mg/dL — ABNORMAL HIGH (ref 0.50–1.10)
GFR calc Af Amer: 12 mL/min — ABNORMAL LOW (ref >90)
GFR calc Af Amer: 11 mL/min — ABNORMAL LOW (ref >90)
GFR calc non Af Amer: 9 mL/min — ABNORMAL LOW (ref >90)
GFR, EST NON AFRICAN AMERICAN: 11 mL/min — AB (ref >90)
Glucose, Bld: 756 mg/dL (ref 70–99)
Glucose, Bld: 174 mg/dL — ABNORMAL HIGH (ref 70–99)
Potassium: 3.3 mEq/L — ABNORMAL LOW (ref 3.7–5.3)
Potassium: 3.9 mEq/L (ref 3.7–5.3)
SODIUM: 140 meq/L (ref 137–147)
Sodium: 136 mEq/L — ABNORMAL LOW (ref 137–147)

## 2013-12-31 LAB — BLOOD GAS, ARTERIAL
Acid-base deficit: 1.6 mmol/L (ref 0.0–2.0)
Bicarbonate: 23 mEq/L (ref 20.0–24.0)
DRAWN BY: 13898
FIO2: 0.9 %
MECHVT: 450 mL
O2 Saturation: 88.8 %
PATIENT TEMPERATURE: 98.9
PEEP/CPAP: 10 cmH2O
PO2 ART: 59.6 mmHg — AB (ref 80.0–100.0)
RATE: 16 resp/min
TCO2: 24.2 mmol/L (ref 0–100)
pCO2 arterial: 41 mmHg (ref 35.0–45.0)
pH, Arterial: 7.368 (ref 7.350–7.450)

## 2013-12-31 LAB — GLUCOSE, CAPILLARY
GLUCOSE-CAPILLARY: 128 mg/dL — AB (ref 70–99)
GLUCOSE-CAPILLARY: 182 mg/dL — AB (ref 70–99)
GLUCOSE-CAPILLARY: 188 mg/dL — AB (ref 70–99)
Glucose-Capillary: 166 mg/dL — ABNORMAL HIGH (ref 70–99)
Glucose-Capillary: 129 mg/dL — ABNORMAL HIGH (ref 70–99)

## 2013-12-31 MED ORDER — SODIUM BICARBONATE 650 MG PO TABS
1300.0000 mg | ORAL_TABLET | Freq: Three times a day (TID) | ORAL | Status: DC
  Administered 2013-12-31 – 2014-01-07 (×23): 1300 mg via ORAL
  Filled 2013-12-31 (×26): qty 2

## 2013-12-31 MED ORDER — VITAL HIGH PROTEIN PO LIQD
1000.0000 mL | ORAL | Status: DC
  Administered 2013-12-31: 1000 mL
  Filled 2013-12-31 (×3): qty 1000

## 2013-12-31 MED ORDER — LEVETIRACETAM IN NACL 500 MG/100ML IV SOLN
500.0000 mg | Freq: Two times a day (BID) | INTRAVENOUS | Status: DC
  Administered 2013-12-31 – 2014-01-03 (×6): 500 mg via INTRAVENOUS
  Filled 2013-12-31 (×7): qty 100

## 2013-12-31 NOTE — Op Note (Signed)
[facility] [address] [place], [id number]   ENDOSCOPY PROCEDURE REPORT  PATIENT: [name], [name]  MR#: [id number] BIRTHDATE: Nov 04, 1955 , 57  yrs. old GENDER: Female ENDOSCOPIST: Iva Booparl E Katricia Prehn, MD, [facility] PROCEDURE DATE:  01/26/2014 PROCEDURE:  EGD, diagnostic ASA CLASS:     Class IV INDICATIONS:  Melena. MEDICATIONS: Versed 2 mg IV TOPICAL ANESTHETIC: none  DESCRIPTION OF PROCEDURE: After the risks benefits and alternatives of the procedure were thoroughly explained, informed consent was obtained.  The PENTAX GASTOROSCOPE [id number] endoscope was introduced through the mouth and advanced to the second portion of the duodenum. Without limitations.  The instrument was slowly withdrawn as the mucosa was fully examined.      The upper, middle and distal third of the esophagus were carefully inspected and no abnormalities were noted.  The z-line was well seen at the GEJ.  The endoscope was pushed into the fundus which was normal including a retroflexed view.  The antrum, gastric body, first and second part of the duodenum were unremarkable. Retroflexed views revealed no abnormalities.     The scope was then withdrawn from the patient and the procedure completed.  COMPLICATIONS: There were no complications. ENDOSCOPIC IMPRESSION: Normal EGD  RECOMMENDATIONS: supportive care - no further GI eval not clear she has had major GI bleed some of Hgb decline likely associated with worsening kidney fx   eSigned:  Iva Booparl E Tyce Delcid, MD, [facility] 12/31/2013 11:20 AM

## 2013-12-31 NOTE — Progress Notes (Addendum)
PULMONARY / CRITICAL CARE MEDICINE   Name: [name] MRN: [id number] DOB: 1956/02/24    ADMISSION DATE:  01/13/2014 CONSULTATION DATE:  01/22/2014  REFERRING MD :  [facility] >> EDP  CHIEF COMPLAINT:  Acute anemia, GIB  INITIAL PRESENTATION: 58 yo trach dependent female with recent ICH 01/2013 complicated by respiratory failure. 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 She was transfered from [facility] with anemia 4.6 requiring transfusion & AKI  STUDIES:  7/30 - FOBT (Positive)  SIGNIFICANT EVENTS: 01/2014 - ICH > resulting in chronic resp failure with trach status.  7/21 > 7/23 - Admission for anemia requiring transfusion 7/30 - admitted for anemia Hgb 4.6, transfuse PRBC, consulted GI, HyperK to 6.5, worsening AKI 7/31 - Hgb improved to 7.3 (s/p 3u PRBC), K down to 5.3 (s/p kayexalate), Lactic acid 1.4, PCT stable 4.15         - Remains on vent support, resolved bleeding, CXR (inc b/l interstitial opacities edema vs PNA)  ANTIBIOTICS: Vancomycin 7/30 x 1 dose Zosyn 7/30 >>  CULTURES: Blood x2 7/30 >>>ng Respiratory 7/30 >>>GNR >>  LINES/TUBES: Tracheostomy, chronic PIVs  SUBJECTIVE:  On 90%/PEEP 10 Afebrile Poor UO  VITAL SIGNS: Temp:  [97.6 F (36.4 C)-101.4 F (38.6 C)] 97.6 F (36.4 C) (08/02 0856) Pulse Rate:  [64-88] 64 (08/02 1200) Resp:  [0-31] 21 (08/02 1200) BP: (106-159)/(57-83) 106/59 mmHg (08/02 1200) SpO2:  [88 %-98 %] 93 % (08/02 1200) FiO2 (%):  [50 %-100 %] 50 % (08/02 1200) HEMODYNAMICS:   VENTILATOR SETTINGS: Vent Mode:  [-] PRVC FiO2 (%):  [50 %-100 %] 50 % Set Rate:  [14 bmp] 14 bmp Vt Set:  [450 mL] 450 mL PEEP:  [10 cmH20] 10 cmH20 Plateau Pressure:  [25 cmH20-37 cmH20] 25 cmH20 INTAKE / OUTPUT:  Intake/Output Summary (Last 24 hours) at 12/31/13 1431 Last data filed at 12/31/13 1200  Gross per 24 hour  Intake   2575 ml  Output     75 ml  Net   2500 ml    PHYSICAL EXAMINATION: General:  Obese, chronically ill,  NAD Neuro:  Awake, Non-purposeful facial / eye movements, some withdrawal to pain HEENT:  Chronic trach on vent Cardiovascular:  RRR, no murmurs Lungs:  Persistent bilateral coarse breath sounds, no wheezing, no focal crackles. Abdomen:  Obese, soft, non-distended, +active BS in all quads, PEG tube Musculoskeletal: persistent b/l +1 pitting edema lower and upper ext Skin:  Warm, sacral decub with dressing  LABS:  CBC  Recent Labs Lab 12/29/13 1500 01/09/2014 0340 01/01/2014 1600  WBC 14.0* 12.7* 14.5*  HGB 7.0* 7.2* 7.1*  HCT 21.2* 21.8* 21.4*  PLT 208 205 222   Coag's  Recent Labs Lab 01/15/2014 1713  APTT 43*  INR 1.66*   BMET  Recent Labs Lab 01/09/2014 0340 12/31/13 0525 12/31/13 0745  NA 142 136* 140  K 4.5 3.3* 3.9  CL 99 84* 92*  CO2 21 33* 21  BUN 166* 141* 160*  CREATININE 4.35* 4.23* 4.73*  GLUCOSE 125* 756* 174*   Electrolytes  Recent Labs Lab 12/31/2013 0340 12/31/13 0525 12/31/13 0745  CALCIUM 8.2* 7.0* 8.0*  MG 2.9*  --   --   PHOS 6.8*  --   --    Sepsis Markers  Recent Labs Lab January 14, 2014 1807 12/31/2013 2003 12/29/13 0400 01/29/2014 0340  LATICACIDVEN 1.27  --  1.4  --   PROCALCITON  --  4.25 4.15 4.25  4.16   ABG  Recent  Labs Lab 12/29/13 1742 01/09/2014 0516 12/31/13 0515  PHART 7.280* 7.296* 7.368  PCO2ART 45.1* 43.8 41.0  PO2ART 170.0* 67.6* 59.6*   Liver Enzymes  Recent Labs Lab 12/16/2013 1713  AST 68*  ALT 42*  ALKPHOS 339*  BILITOT 1.1  ALBUMIN 2.0*   Cardiac Enzymes No results found for this basename: TROPONINI, PROBNP,  in the last 168 hours Glucose  Recent Labs Lab 01/04/2014 1549 01/24/2014 2003 01/26/2014 2124 12/31/13 0009 12/31/13 0436 12/31/13 0835  GLUCAP 166* 167* 182* 188* 182* 166*    Imaging US Renal  12/31/2013   CLINICAL DATA:  Acute renal insufficiency superimposed upon chronic kidney disease. Current history of diabetes, hypertension and chronic ventilator dependent respiratory failure.  EXAM:  RENAL/URINARY TRACT ULTRASOUND COMPLETE  COMPARISON:  None.  FINDINGS: Right Kidney:  Length: Approximately 11.9 cm. No hydronephrosis. Well-preserved cortex. Echogenic parenchyma without focal parenchymal abnormality. No visible shadowing calculi.  Left Kidney:  Length: Approximately 12.8 cm. No hydronephrosis. Well-preserved cortex. Normal parenchymal echotexture. Approximate 2.8 cm cyst arising from the lower pole. No solid renal mass. No visible shadowing calculi.  Bladder:  Decompressed by Foley catheter.  IMPRESSION: 1. No evidence of hydronephrosis involving either kidney to suggest obstruction. 2. Echogenic parenchyma involving the right kidney consistent with chronic kidney disease. 3. Cyst arising from the lower pole of the left kidney. No significant parenchymal abnormality involving either kidney.   Electronically Signed   By: Hulan Saas M.D.   On: 12/31/2013 02:13   Dg Chest Port 1 View  01/24/2014   CLINICAL DATA:  Airspace disease, pulmonary edema versus infiltrates  EXAM: PORTABLE CHEST - 1 VIEW  COMPARISON:  Prior chest x-ray 12/29/2013  FINDINGS: Tracheostomy tube remains in stable position. The tip is midline. Right upper extremity PICC. Catheter tip in good position in the distal SVC. Stable cardiac and mediastinal contours. Improved bilateral interstitial and airspace opacities. No definite effusion or pneumothorax. A small amount of fluid present within the minor fissure.  IMPRESSION: 1. Overall, improved aeration with decreasing airspace opacities most consistent with improving pulmonary edema. There are persistent diffuse background interstitial opacities with patchy areas of airspace opacity. 2. While the interval change over the last 24 hr is likely secondary to improving pulmonary edema, it remains difficult to exclude superimposed infection in the remaining airspace disease. 3. Stable and satisfactory support apparatus.   Electronically Signed   By: Malachy Moan M.D.   On:  01/17/2014 07:11     ASSESSMENT / PLAN:  PULMONARY A: Chronic respiratory failure, trach dependent Recent HCAP, previously treated - CXR 7/31 with inc b/l interstitial edema vs PNA P:   Full vent support, 8cc/kg, RR 15. Ct PEEP 10   CARDIOVASCULAR A: HTN, pre-admit Lactate normal - 1.4 P:  Monitor BP, MAP goal >65 Hold home BP meds - Hydralazine, Minoxidil  RENAL A:  AKI, suspected pre-renal azotemia, in setting of worsening CKD & UGIB - elevated BUN >160, doubt compartment syndrome Hyperkalemia - Improved to 5.3, s/p kayexalate P:   Dc Bicarb gtt - start 1300 po bicarb tid Monitor BMET Agree with  Renal - poor dialysis candidate.  GASTROINTESTINAL A: Acute GI Bleed, with melena - FOBT (positive), EGD neg PEG dependent GERD P:   Consulted GI ([facility]) - EGD being planned Cont Protonix IV q 12h, off gtt Resume TF @ 10/h  HEMATOLOGIC A: Acute Blood Loss Anemia, s/p 3u PRBC - Improved Hgb to 7.3 Acute on chronic anemia P:  Monitor CBC daily Transfuse PRBC  for Hb <7 VTE prophylaxis - SCDs  INFECTIOUS A: Recently treated for HCAP- Inc b/l interstitial opacity on CXR 7/31, dec WBC, afebrile but GNR in sputum P:   Resume  zosyn If febrile or WBC increases then will reconsider starting vanc  ENDOCRINE A: DM, Type 2 Hypoglycemia P:   Resistant SSI CBG monitoring q 4 hr, ct D10  Hold Lantus while NPO  NEUROLOGIC A: Persistent vegetative state, s/p ICH Hx Seizures P:   Resume Keppra    GLOBAL: Code Status - FULL, Palliative consulted (previously established and prior discussions with family) -ethics consult recommended. All consultants feel that she should not be put on pressors or hemodialysis  TODAY'S SUMMARY: Remains in ICU for vent support with chronic trach in setting of persistent veg state, GIB seems to be resolved with stable Hgb (s/p 3u PRBC)   CC time 32 min.  Cyril Mourningakesh Jamire Shabazz MD. Tonny BollmanFCCP.  Pulmonary & Critical care Pager [phone] If  no response call [phone]    12/31/2013, 2:31 PM

## 2013-12-31 NOTE — Progress Notes (Signed)
[facility] ROUNDING NOTE   Subjective:   Interval History: critically ill appreciate palliative care help  Objective:  Vital signs in last 24 hours:  Temp:  [97.6 F (36.4 C)-101.4 F (38.6 C)] 97.6 F (36.4 C) (08/02 0856) Pulse Rate:  [68-95] 69 (08/02 1105) Resp:  [0-31] 25 (08/02 1105) BP: (109-159)/(53-83) 116/69 mmHg (08/02 1105) SpO2:  [80 %-98 %] 95 % (08/02 1105) FiO2 (%):  [80 %-100 %] 90 % (08/02 1105)  Weight change:  Filed Weights   12/16/2013 2145 12/29/13 0500 01/13/2014 0408  Weight: 85.1 kg (187 lb 9.8 oz) 85.1 kg (187 lb 9.8 oz) 91.7 kg (202 lb 2.6 oz)    Intake/Output: I/O last 3 completed shifts: In: 4545 [P.O.:5; I.V.:4440; Other:50; IV Piggyback:50] Out: 55 [Urine:55]   Intake/Output this shift:  Total I/O In: 400 [I.V.:250; IV Piggyback:150] Out: 40 [Urine:40]  General: Obese, vegetative state  HEENT: Chronic trach on ventilator  Cardiovascular: RRR  Lungs: Persistent bilateral coarse breath sounds  Abdomen: Obese, PEG tube  Edema diffuse  Skin: Warm, sacral decub with dressing    Basic Metabolic Panel:  Recent Labs Lab 12/29/13 12/29/13 0400 01/18/2014 0340 12/31/13 0525 12/31/13 0745  NA 140 142 142 136* 140  K 6.1* 5.3 4.5 3.3* 3.9  CL 101 102 99 84* 92*  CO2 18* 19 21 33* 21  GLUCOSE 300* 192* 125* 756* 174*  BUN 171* 165* 166* 141* 160*  CREATININE 3.86* 3.93* 4.35* 4.23* 4.73*  CALCIUM 8.7 8.4 8.2* 7.0* 8.0*  MG  --   --  2.9*  --   --   PHOS  --   --  6.8*  --   --     Liver Function Tests:  Recent Labs Lab 12/17/2013 1713  AST 68*  ALT 42*  ALKPHOS 339*  BILITOT 1.1  PROT 7.9  ALBUMIN 2.0*   No results found for this basename: LIPASE, AMYLASE,  in the last 168 hours No results found for this basename: AMMONIA,  in the last 168 hours  CBC:  Recent Labs Lab 12/18/2013 1713  12/29/13 0759 12/29/13 1159 12/29/13 1500 01/19/2014 0340 01/13/2014 1600  WBC 14.0*  < > 13.5* 12.9* 14.0* 12.7* 14.5*   NEUTROABS 12.6*  --   --   --   --   --   --   HGB 4.6*  < > 7.4* 7.4* 7.0* 7.2* 7.1*  HCT 14.3*  < > 22.8* 23.0* 21.2* 21.8* 21.4*  MCV 90.5  < > 86.0 87.8 85.1 87.2 87.7  PLT 245  < > 219 208 208 205 222  < > = values in this interval not displayed.  Cardiac Enzymes: No results found for this basename: CKTOTAL, CKMB, CKMBINDEX, TROPONINI,  in the last 168 hours  BNP: No components found with this basename: POCBNP,   CBG:  Recent Labs Lab 12/31/2013 2003 01/22/2014 2124 12/31/13 0009 12/31/13 0436 12/31/13 0835  GLUCAP 167* 182* 188* 182* 166*    Microbiology: Results for orders placed during the hospital encounter of 12/18/2013  CULTURE, BLOOD (ROUTINE X 2)     Status: None   Collection Time    12/20/2013  5:09 PM      Result Value Ref Range Status   Specimen Description BLOOD RIGHT PICC LINE   Final   Special Requests BOTTLES DRAWN AEROBIC AND ANAEROBIC 3CC   Final   Culture  Setup Time     Final   Value: 12/29/2013 00:27     Performed  at [facility]     Final   Value:        BLOOD CULTURE RECEIVED NO GROWTH TO DATE CULTURE WILL BE HELD FOR 5 DAYS BEFORE ISSUING A FINAL NEGATIVE REPORT     Performed at [facility]   Report Status PENDING   Incomplete  CULTURE, RESPIRATORY (NON-EXPECTORATED)     Status: None   Collection Time    12/20/2013  6:00 PM      Result Value Ref Range Status   Specimen Description TRACHEAL ASPIRATE   Final   Special Requests Normal   Final   Gram Stain     Final   Value: ABUNDANT WBC PRESENT,BOTH PMN AND MONONUCLEAR     RARE SQUAMOUS EPITHELIAL CELLS PRESENT     MODERATE GRAM NEGATIVE RODS     Performed at [facility]   Culture     Final   Value: ABUNDANT GRAM NEGATIVE RODS     Performed at [facility]   Report Status PENDING   Incomplete  CULTURE, BLOOD (ROUTINE X 2)     Status: None   Collection Time    12/11/2013  7:22 PM      Result Value Ref Range Status   Specimen Description BLOOD HAND  LEFT   Final   Special Requests BOTTLES DRAWN AEROBIC AND ANAEROBIC 5CC   Final   Culture  Setup Time     Final   Value: 12/29/2013 00:28     Performed at [facility]   Culture     Final   Value:        BLOOD CULTURE RECEIVED NO GROWTH TO DATE CULTURE WILL BE HELD FOR 5 DAYS BEFORE ISSUING A FINAL NEGATIVE REPORT     Performed at [facility]   Report Status PENDING   Incomplete    Coagulation Studies:  Recent Labs  12/19/2013 1713  LABPROT 19.6*  INR 1.66*    Urinalysis:  Recent Labs  12/29/13 0513 12/29/13 1244  COLORURINE AMBER* AMBER*  LABSPEC 1.027 1.026  PHURINE 5.0 5.5  GLUCOSEU NEGATIVE NEGATIVE  HGBUR MODERATE* LARGE*  BILIRUBINUR SMALL* SMALL*  KETONESUR NEGATIVE NEGATIVE  PROTEINUR >300* >300*  UROBILINOGEN 0.2 0.2  NITRITE NEGATIVE NEGATIVE  LEUKOCYTESUR MODERATE* LARGE*      Imaging: US Renal  12/31/2013   CLINICAL DATA:  Acute renal insufficiency superimposed upon chronic kidney disease. Current history of diabetes, hypertension and chronic ventilator dependent respiratory failure.  EXAM: RENAL/URINARY TRACT ULTRASOUND COMPLETE  COMPARISON:  None.  FINDINGS: Right Kidney:  Length: Approximately 11.9 cm. No hydronephrosis. Well-preserved cortex. Echogenic parenchyma without focal parenchymal abnormality. No visible shadowing calculi.  Left Kidney:  Length: Approximately 12.8 cm. No hydronephrosis. Well-preserved cortex. Normal parenchymal echotexture. Approximate 2.8 cm cyst arising from the lower pole. No solid renal mass. No visible shadowing calculi.  Bladder:  Decompressed by Foley catheter.  IMPRESSION: 1. No evidence of hydronephrosis involving either kidney to suggest obstruction. 2. Echogenic parenchyma involving the right kidney consistent with chronic kidney disease. 3. Cyst arising from the lower pole of the left kidney. No significant parenchymal abnormality involving either kidney.   Electronically Signed   By: Hulan Saas M.D.    On: 12/31/2013 02:13   Dg Chest Port 1 View  12/31/2013   CLINICAL DATA:  Pneumonitis  EXAM: PORTABLE CHEST - 1 VIEW  COMPARISON:  07/30/2013  FINDINGS: Multifocal patchy airspace opacities, suspicious for moderate interstitial edema, increased. Multifocal pneumonia is  possible. Suspected small left pleural effusion.  The heart is top-normal in size.  Tracheostomy in satisfactory position.  Right arm PICC terminating at the cavoatrial junction.  IMPRESSION: Worsening multifocal patchy airspace opacities, suspicious for moderate interstitial edema, less likely multifocal pneumonia.  Suspected small left pleural effusion.  Tracheostomy in satisfactory position.   Electronically Signed   By: Charline Bills M.D.   On: 12/31/2013 07:48   Dg Chest Port 1 View  01/20/2014   CLINICAL DATA:  Airspace disease, pulmonary edema versus infiltrates  EXAM: PORTABLE CHEST - 1 VIEW  COMPARISON:  Prior chest x-ray 12/29/2013  FINDINGS: Tracheostomy tube remains in stable position. The tip is midline. Right upper extremity PICC. Catheter tip in good position in the distal SVC. Stable cardiac and mediastinal contours. Improved bilateral interstitial and airspace opacities. No definite effusion or pneumothorax. A small amount of fluid present within the minor fissure.  IMPRESSION: 1. Overall, improved aeration with decreasing airspace opacities most consistent with improving pulmonary edema. There are persistent diffuse background interstitial opacities with patchy areas of airspace opacity. 2. While the interval change over the last 24 hr is likely secondary to improving pulmonary edema, it remains difficult to exclude superimposed infection in the remaining airspace disease. 3. Stable and satisfactory support apparatus.   Electronically Signed   By: Malachy Moan M.D.   On: 01/18/2014 07:11     Medications:   . sodium chloride 10 mL/hr at 12/31/13 0800  . dextrose 30 mL/hr at 12/31/13 0800  . pantoprozole (PROTONIX)  infusion 8 mg/hr (01/13/2014 1700)  .  sodium bicarbonate  infusion 1000 mL 75 mL/hr at 12/31/13 0900   . sodium chloride   Intravenous Once  . antiseptic oral rinse  7 mL Mouth Rinse QID  . chlorhexidine  15 mL Mouth Rinse BID  . insulin aspart  0-20 Units Subcutaneous 6 times per day  . [START ON 01/01/2014] pantoprazole (PROTONIX) IV  40 mg Intravenous Q12H  . piperacillin-tazobactam (ZOSYN)  IV  2.25 g Intravenous 4 times per day  . sodium chloride  10-40 mL Intracatheter Q12H   sodium chloride, fentaNYL, sodium chloride  Assessment/ Plan:  This is a very sad situation of a young lady in a persistent vegitative state with recurrent GI bleed Her renal failure is most likely ischemic ATN versus volume depletion. She is making little urine. I do not think chronic dialysis is appropriate and should not be considered, ethics and palliative care are assisting [place] in the decisions Hyperkalemia resolved  Metabolic acidosis improved  HTN/volume stable   Very difficult discussions with family. Fortunately no absolute indications for dialysis     LOS: 3 [place] W @TODAY @11 :21 AM

## 2014-01-01 ENCOUNTER — Encounter (HOSPITAL_COMMUNITY): Payer: Self-pay | Admitting: Internal Medicine

## 2014-01-01 DIAGNOSIS — D649 Anemia, unspecified: Secondary | ICD-10-CM

## 2014-01-01 DIAGNOSIS — K921 Melena: Secondary | ICD-10-CM | POA: Diagnosis not present

## 2014-01-01 DIAGNOSIS — K922 Gastrointestinal hemorrhage, unspecified: Secondary | ICD-10-CM | POA: Diagnosis not present

## 2014-01-01 LAB — CULTURE, RESPIRATORY W GRAM STAIN: Special Requests: NORMAL

## 2014-01-01 LAB — GLUCOSE, CAPILLARY
GLUCOSE-CAPILLARY: 91 mg/dL (ref 70–99)
GLUCOSE-CAPILLARY: 124 mg/dL — AB (ref 70–99)
GLUCOSE-CAPILLARY: 126 mg/dL — AB (ref 70–99)
Glucose-Capillary: 116 mg/dL — ABNORMAL HIGH (ref 70–99)
Glucose-Capillary: 116 mg/dL — ABNORMAL HIGH (ref 70–99)
Glucose-Capillary: 126 mg/dL — ABNORMAL HIGH (ref 70–99)
Glucose-Capillary: 126 mg/dL — ABNORMAL HIGH (ref 70–99)
Glucose-Capillary: 99 mg/dL (ref 70–99)

## 2014-01-01 LAB — CBC
HCT: 19 % — ABNORMAL LOW (ref 36.0–46.0)
HEMOGLOBIN: 6.4 g/dL — AB (ref 12.0–15.0)
MCH: 28.4 pg (ref 26.0–34.0)
MCHC: 33.7 g/dL (ref 30.0–36.0)
MCV: 84.4 fL (ref 78.0–100.0)
Platelets: 212 10*3/uL (ref 150–400)
RBC: 2.25 MIL/uL — ABNORMAL LOW (ref 3.87–5.11)
RDW: 19.2 % — ABNORMAL HIGH (ref 11.5–15.5)
WBC: 13.9 10*3/uL — ABNORMAL HIGH (ref 4.0–10.5)

## 2014-01-01 LAB — CULTURE, RESPIRATORY

## 2014-01-01 LAB — OCCULT BLOOD X 1 CARD TO LAB, STOOL: Fecal Occult Bld: NEGATIVE

## 2014-01-01 LAB — PREPARE RBC (CROSSMATCH)

## 2014-01-01 MED ORDER — VITAL HIGH PROTEIN PO LIQD
1000.0000 mL | ORAL | Status: DC
  Administered 2014-01-01 – 2014-01-07 (×5): 1000 mL
  Filled 2014-01-01 (×9): qty 1000

## 2014-01-01 MED ORDER — SODIUM CHLORIDE 0.9 % IV SOLN
Freq: Once | INTRAVENOUS | Status: AC
  Administered 2014-01-01: 15:00:00 via INTRAVENOUS

## 2014-01-01 NOTE — Progress Notes (Signed)
S:unresponsive O:BP 111/59  Pulse 64  Temp(Src) 93.5 F (34.2 C) (Axillary)  Resp 0  Ht 5' 1.81" (1.57 m)  Wt 94.3 kg (207 lb 14.3 oz)  BMI 38.26 kg/m2  SpO2 87%  Intake/Output Summary (Last 24 hours) at 01/01/14 0846 Last data filed at 01/01/14 0700  Gross per 24 hour  Intake   1170 ml  Output     45 ml  Net   1125 ml   Weight change:  ZOX:WRUEAVWGen:Trached, drooling, unresponsive CVS:RRR Resp: basilar crackles Abd:+ BS + sub Q edema Ext: 2+ edema NEURO:unresponsive   . sodium chloride   Intravenous Once  . antiseptic oral rinse  7 mL Mouth Rinse QID  . chlorhexidine  15 mL Mouth Rinse BID  . feeding supplement (VITAL HIGH PROTEIN)  1,000 mL Per Tube Q24H  . insulin aspart  0-20 Units Subcutaneous 6 times per day  . levETIRAcetam  500 mg Intravenous Q12H  . pantoprazole (PROTONIX) IV  40 mg Intravenous Q12H  . piperacillin-tazobactam (ZOSYN)  IV  2.25 g Intravenous 4 times per day  . sodium bicarbonate  1,300 mg Oral TID  . sodium chloride  10-40 mL Intracatheter Q12H   Dg Chest Port 1 View  12/31/2013   CLINICAL DATA:  Pneumonitis  EXAM: PORTABLE CHEST - 1 VIEW  COMPARISON:  07/30/2013  FINDINGS: Multifocal patchy airspace opacities, suspicious for moderate interstitial edema, increased. Multifocal pneumonia is possible. Suspected small left pleural effusion.  The heart is top-normal in size.  Tracheostomy in satisfactory position.  Right arm PICC terminating at the cavoatrial junction.  IMPRESSION: Worsening multifocal patchy airspace opacities, suspicious for moderate interstitial edema, less likely multifocal pneumonia.  Suspected small left pleural effusion.  Tracheostomy in satisfactory position.   Electronically Signed   By: Charline BillsSriyesh  Krishnan M.D.   On: 12/31/2013 07:48   BMET    Component Value Date/Time   NA 140 12/31/2013 0745   K 3.9 12/31/2013 0745   CL 92* 12/31/2013 0745   CO2 21 12/31/2013 0745   GLUCOSE 174* 12/31/2013 0745   BUN 160* 12/31/2013 0745   CREATININE 4.73*  12/31/2013 0745   CALCIUM 8.0* 12/31/2013 0745   GFRNONAA 9* 12/31/2013 0745   GFRAA 11* 12/31/2013 0745   CBC    Component Value Date/Time   WBC 14.5* 01/10/2014 1600   RBC 2.44* 01/23/2014 1600   RBC 2.10* 12/19/2013 1710   HGB 7.1* 01/21/2014 1600   HCT 21.4* 01/07/2014 1600   PLT 222 01/23/2014 1600   MCV 87.7 01/26/2014 1600   MCH 29.1 01/11/2014 1600   MCHC 33.2 01/06/2014 1600   RDW 19.2* 01/20/2014 1600   LYMPHSABS 0.9 11/30/2013 1713   MONOABS 0.4 12/18/2013 1713   EOSABS 0.1 12/20/2013 1713   BASOSABS 0.0 12/06/2013 1713     Assessment: 1. ARF most likely sec to ATN 2. Persistent vegetative state 3. Anemia 4. Hx intracerebral bleed  Plan: 1. Spoke with Dr Kendrick FriesMcQuaid.  Futile care being provided.  Ethics to be involved.  He agrees that dialysis is not an option.  I will sign off   Iniya Matzek T

## 2014-01-01 NOTE — Progress Notes (Addendum)
01/01/2014 Additional Medicare IM given. UR completed. Isidoro DonningAlesia Ciclaly Mulcahey RN CCM Case Mgmt phone [phone]

## 2014-01-01 NOTE — Progress Notes (Signed)
PULMONARY / CRITICAL CARE MEDICINE   Name: [name] MRN: [id number] DOB: 07-Oct-1955    ADMISSION DATE:  12/11/2013 CONSULTATION DATE:  12/08/2013  REFERRING MD :  [facility] >> [facility]  CHIEF COMPLAINT:  Acute anemia, GIB  INITIAL PRESENTATION: 58 yo trach dependent female with recent ICH 01/2013 complicated by respiratory failure. 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 She was transfered from [facility] with anemia 4.6 requiring transfusion & AKI.  STUDIES:  7/30 - FOBT (Positive)  SIGNIFICANT EVENTS: 01/2013 - ICH > resulting in chronic resp failure with trach status.  7/21 > 7/23 - Admission for anemia requiring transfusion 7/30 - admitted for anemia Hgb 4.6, transfuse PRBC, consulted GI, HyperK to 6.5, worsening AKI 7/31 - Hgb improved to 7.3 (s/p 3u PRBC), K down to 5.3 (s/p kayexalate), Lactic acid 1.4, PCT stable 4.15         - Remains on vent support, resolved bleeding, CXR (inc b/l interstitial opacities edema vs PNA) 8/1 EGD > no clear evidence of bleeding  ANTIBIOTICS: Vancomycin 7/30 x 1 dose Zosyn 7/30 >>  CULTURES: Blood x2 7/30 >>>ng Respiratory 7/30 >>>pseudomonas pan sensitive  LINES/TUBES: Tracheostomy, chronic PIVs  SUBJECTIVE:  Oliguric, no signs of bleeding  VITAL SIGNS: Temp:  [92.8 F (33.8 C)-98.7 F (37.1 C)] 93.5 F (34.2 C) (08/03 0333) Pulse Rate:  [59-70] 70 (08/03 0855) Resp:  [0-29] 23 (08/03 0855) BP: (95-132)/(56-74) 111/59 mmHg (08/03 0855) SpO2:  [87 %-97 %] 87 % (08/03 0700) FiO2 (%):  [90 %-100 %] 100 % (08/03 0855) Weight:  [94.3 kg (207 lb 14.3 oz)] 94.3 kg (207 lb 14.3 oz) (08/03 0500) HEMODYNAMICS:   VENTILATOR SETTINGS: Vent Mode:  [-] PRVC FiO2 (%):  [90 %-100 %] 100 % Set Rate:  [14 bmp-16 bmp] 16 bmp Vt Set:  [450 mL] 450 mL PEEP:  [10 cmH20] 10 cmH20 Plateau Pressure:  [25 cmH20-36 cmH20] 27 cmH20 INTAKE / OUTPUT:  Intake/Output Summary (Last 24 hours) at 01/01/14 0907 Last data filed at 01/01/14  0700  Gross per 24 hour  Intake   1055 ml  Output     45 ml  Net   1010 ml    PHYSICAL EXAMINATION:  Gen: does not withdrawal to pain,  HEENT: NCAT, EOMi, trach PULM: rhonchi bilaterally CV: RRR, S1/S2, cannot assess JVD AB: BS infrequent, soft, nontender, PEG Ext: warm, trace edema, no clubbing, no cyanosis Derm: no rash or skin breakdown Neuro: minimal response to external stimuli   LABS:  CBC  Recent Labs Lab 12/29/13 1500 01/05/2014 0340 01/13/2014 1600  WBC 14.0* 12.7* 14.5*  HGB 7.0* 7.2* 7.1*  HCT 21.2* 21.8* 21.4*  PLT 208 205 222   Coag's  Recent Labs Lab 12/07/2013 1713  APTT 43*  INR 1.66*   BMET  Recent Labs Lab 01/09/2014 0340 12/31/13 0525 12/31/13 0745  NA 142 136* 140  K 4.5 3.3* 3.9  CL 99 84* 92*  CO2 21 33* 21  BUN 166* 141* 160*  CREATININE 4.35* 4.23* 4.73*  GLUCOSE 125* 756* 174*   Electrolytes  Recent Labs Lab 01/01/2014 0340 12/31/13 0525 12/31/13 0745  CALCIUM 8.2* 7.0* 8.0*  MG 2.9*  --   --   PHOS 6.8*  --   --    Sepsis Markers  Recent Labs Lab 12/03/2013 1807 12/03/2013 2003 12/29/13 0400 01/27/2014 0340  LATICACIDVEN 1.27  --  1.4  --   PROCALCITON  --  4.25 4.15 4.25  4.16  ABG  Recent Labs Lab 12/29/13 1742 01/11/2014 0516 12/31/13 0515  PHART 7.280* 7.296* 7.368  PCO2ART 45.1* 43.8 41.0  PO2ART 170.0* 67.6* 59.6*   Liver Enzymes  Recent Labs Lab 2014/01/27 1713  AST 68*  ALT 42*  ALKPHOS 339*  BILITOT 1.1  ALBUMIN 2.0*   Cardiac Enzymes No results found for this basename: TROPONINI, PROBNP,  in the last 168 hours Glucose  Recent Labs Lab 12/31/13 0835 12/31/13 1140 12/31/13 1602 01/01/14 0006 01/01/14 0342 01/01/14 0813  GLUCAP 166* 129* 128* 99 116* 124*    Imaging Dg Chest Port 1 View  12/31/2013   CLINICAL DATA:  Pneumonitis  EXAM: PORTABLE CHEST - 1 VIEW  COMPARISON:  07/30/2013  FINDINGS: Multifocal patchy airspace opacities, suspicious for moderate interstitial edema, increased.  Multifocal pneumonia is possible. Suspected small left pleural effusion.  The heart is top-normal in size.  Tracheostomy in satisfactory position.  Right arm PICC terminating at the cavoatrial junction.  IMPRESSION: Worsening multifocal patchy airspace opacities, suspicious for moderate interstitial edema, less likely multifocal pneumonia.  Suspected small left pleural effusion.  Tracheostomy in satisfactory position.   Electronically Signed   By: Charline Bills M.D.   On: 12/31/2013 07:48     ASSESSMENT / PLAN:  PULMONARY A: Chronic respiratory failure, trach dependent Recent HCAP, previously treated - CXR 7/31 with inc b/l interstitial edema vs PNA P:   Full vent support, 8cc/kg, RR 15. Continue current vent settings  CARDIOVASCULAR A: HTN, pre-admit> no normotensive Lactate normal - 1.4 P:  Monitor BP, MAP goal >65 Hold home BP meds - Hydralazine, Minoxidil  RENAL A:  AKI> worsening elevated BUN >160, doubt compartment syndrome Hyperkalemia - Improved P:   continue 1300 po bicarb tid Agree with  Renal - dialysis would be of no medical benefit  GASTROINTESTINAL A: Acute GI Bleed, with melena - FOBT (positive), EGD neg PEG dependent GERD P:   Cont Protonix IV q 12h, Resume TF @ 10/h  HEMATOLOGIC A: Acute Blood Loss Anemia, s/p 3u PRBC - Improved Hgb to 7.3 Acute on chronic anemia  P:  Monitor CBC daily Transfuse PRBC for Hb <7 VTE prophylaxis - SCDs  INFECTIOUS A: HCAP> pseudomonas P:   Continue zosyn for total 14 days  ENDOCRINE A: DM, Type 2 Hypoglycemia P:   Resistant SSI CBG monitoring q 4 hr, ct D10  Hold Lantus while NPO  NEUROLOGIC A: Persistent vegetative state, s/p ICH Hx Seizures P:   Resume Keppra    GLOBAL: Code Status  - FULL, Palliative consulted (previously established and prior discussions with family)  -ethics consult recommended. All consultants feel that she should not be put on pressors or hemodialysis  TODAY'S  SUMMARY: Need cbc again today. Overall the care we are providing is of no medical benefit. Will seek to discuss with family.   CC time 35 min.  Heber Alton, MD [place] [facility] Pager: [phone] Cell: [phone] If no response, call [phone]    01/01/2014, 9:07 AM

## 2014-01-01 NOTE — Significant Event (Signed)
CRITICAL VALUE ALERT     Critical value received:  HGB  Date of notification:  08/03  Time of notification:  1130  Critical value read back:Yes.    Nurse who received alert:  Laverna PeaceShanna Shaurya Rawdon  MD notified (1st page):  Dr. Kendrick FriesMcQuaid  Time of first page:  1130     MD notified (2nd page):  Time of second page:   Responding MD: Dr. Kendrick FriesMcQuaid   Time MD responded:  [phone]

## 2014-01-01 NOTE — Progress Notes (Addendum)
NUTRITION CONSULT/FOLLOW UP  DOCUMENTATION CODES Per approved criteria  -Obesity Unspecified   INTERVENTION: Initiate Vital HP formula at 15 ml/hr and increase by 10 ml every 4 hours to goal rate of 45 ml/hr to provide 1080 kcals (61% of estimated kcal needs), 95 gm protein (100% of estimated protein needs), 903 ml of free water RD to follow for nutrition care plan  NUTRITION DIAGNOSIS: Inadequate oral intake related to inability to eat as evidenced by NPO, ongoing  Goal: Enteral nutrition to provide 60-70% of estimated calorie needs (22-25 kcals/kg ideal body weight) and 100% of estimated protein needs, based on ASPEN guidelines for permissive underfeeding in critically ill obese individuals  Monitor:  TF regimen & tolerance, respiratory status, weight, labs, I/O's  ASSESSMENT: 58 y.o. female in a persistent vegetative state s/p ICH in 01/2013 which was complicated by respiratory failure. She was transferred from [facility] with anemia requiring transfusion with a hx of the same. She has been transfused three units and her hgb has risen from 4.6 on 2014-01-17 to 7.4 today. Nursing staff states pt had an episode of black tarry stools.  Patient s/p procedure 8/01: ESOPHAGOGASTRODUODENOSCOPY (EGD)  Patient is currently intubated on ventilator support MV: 11.6 L/min Temp (24hrs), Avg:94.7 F (34.8 C), Min:92.8 F (33.8 C), Max:98.7 F (37.1 C)   Patient with PEG tube in place.  Unknown insertion date or usual TF regimen.  RD consulted for via Adult Tube Feeding Protocol for TF initiation & management.  Height: Ht Readings from Last 1 Encounters:  2014/01/17 5' 1.81" (1.57 m)    Weight: Wt Readings from Last 1 Encounters:  01/01/14 207 lb 14.3 oz (94.3 kg)    BMI:  Body mass index is 38.26 kg/(m^2).  Estimated Nutritional Needs: Kcal: 1758 Protein: 80-95 gm Fluid: per MD  Skin: Stage I pressure ulcer on sacrum  Diet Order: NPO   Intake/Output Summary (Last 24  hours) at 01/01/14 0856 Last data filed at 01/01/14 0700  Gross per 24 hour  Intake   1170 ml  Output     45 ml  Net   1125 ml    Labs:   Recent Labs Lab 01/24/2014 0340 12/31/13 0525 12/31/13 0745  NA 142 136* 140  K 4.5 3.3* 3.9  CL 99 84* 92*  CO2 21 33* 21  BUN 166* 141* 160*  CREATININE 4.35* 4.23* 4.73*  CALCIUM 8.2* 7.0* 8.0*  MG 2.9*  --   --   PHOS 6.8*  --   --   GLUCOSE 125* 756* 174*    CBG (last 3)   Recent Labs  01/01/14 0006 01/01/14 0342 01/01/14 0813  GLUCAP 99 116* 124*    Scheduled Meds: . sodium chloride   Intravenous Once  . antiseptic oral rinse  7 mL Mouth Rinse QID  . chlorhexidine  15 mL Mouth Rinse BID  . feeding supplement (VITAL HIGH PROTEIN)  1,000 mL Per Tube Q24H  . insulin aspart  0-20 Units Subcutaneous 6 times per day  . levETIRAcetam  500 mg Intravenous Q12H  . pantoprazole (PROTONIX) IV  40 mg Intravenous Q12H  . piperacillin-tazobactam (ZOSYN)  IV  2.25 g Intravenous 4 times per day  . sodium bicarbonate  1,300 mg Oral TID  . sodium chloride  10-40 mL Intracatheter Q12H    Continuous Infusions: . sodium chloride 10 mL/hr at 12/31/13 0800    Past Medical History  Diagnosis Date  . Chronic respiratory failure   . Tracheostomy status   .  Persist vegetative state   . 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 Type II diabetes mellitus   . HTN (hypertension)   . Anemia, unspecified   . CKD (chronic kidney disease) stage 2, GFR 60-89 ml/min   . Corneal abscess     history of   . Gastro-esophageal reflux   . Chronic constipation   . 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 Peg tube placement      unknown insertion date   . Tracheostomy      unknown insertion site    Maureen Chatters, [place], LDN Pager #: [phone] After-Hours Pager #: [phone]

## 2014-01-01 NOTE — Progress Notes (Signed)
Patient desaturated to low 80's for 5 minutes after being turned in bed.  Performed Lung Recruitment maneuver Pressure Control 40%, Rate-10, Peep-5, I-time 3 seconds, Fio2-100% for 2 minutes.  Sats returned to 97%, and patient tolerated well. Rt to continue to monitor.

## 2014-01-02 ENCOUNTER — Encounter (HOSPITAL_COMMUNITY): Payer: Self-pay | Admitting: *Deleted

## 2014-01-02 DIAGNOSIS — K922 Gastrointestinal hemorrhage, unspecified: Secondary | ICD-10-CM | POA: Diagnosis not present

## 2014-01-02 DIAGNOSIS — K921 Melena: Secondary | ICD-10-CM | POA: Diagnosis not present

## 2014-01-02 LAB — TYPE AND SCREEN
ABO/RH(D): O POS
Antibody Screen: NEGATIVE
Unit division: 0

## 2014-01-02 LAB — GLUCOSE, CAPILLARY
GLUCOSE-CAPILLARY: 125 mg/dL — AB (ref 70–99)
GLUCOSE-CAPILLARY: 188 mg/dL — AB (ref 70–99)
Glucose-Capillary: 156 mg/dL — ABNORMAL HIGH (ref 70–99)
Glucose-Capillary: 180 mg/dL — ABNORMAL HIGH (ref 70–99)
Glucose-Capillary: 182 mg/dL — ABNORMAL HIGH (ref 70–99)
Glucose-Capillary: 188 mg/dL — ABNORMAL HIGH (ref 70–99)

## 2014-01-02 LAB — BASIC METABOLIC PANEL
ANION GAP: 27 — AB (ref 5–15)
BUN: 165 mg/dL — AB (ref 6–23)
CALCIUM: 8.3 mg/dL — AB (ref 8.4–10.5)
CHLORIDE: 93 meq/L — AB (ref 96–112)
CO2: 24 meq/L (ref 19–32)
CREATININE: 5.46 mg/dL — AB (ref 0.50–1.10)
GFR calc Af Amer: 9 mL/min — ABNORMAL LOW (ref >90)
GFR calc non Af Amer: 8 mL/min — ABNORMAL LOW (ref >90)
Glucose, Bld: 181 mg/dL — ABNORMAL HIGH (ref 70–99)
Potassium: 3.6 mEq/L — ABNORMAL LOW (ref 3.7–5.3)
Sodium: 144 mEq/L (ref 137–147)

## 2014-01-02 LAB — CBC
HEMATOCRIT: 23.3 % — AB (ref 36.0–46.0)
Hemoglobin: 7.8 g/dL — ABNORMAL LOW (ref 12.0–15.0)
MCH: 29.2 pg (ref 26.0–34.0)
MCHC: 33.5 g/dL (ref 30.0–36.0)
MCV: 87.3 fL (ref 78.0–100.0)
PLATELETS: 237 10*3/uL (ref 150–400)
RBC: 2.67 MIL/uL — AB (ref 3.87–5.11)
RDW: 19.2 % — ABNORMAL HIGH (ref 11.5–15.5)
WBC: 11.3 10*3/uL — AB (ref 4.0–10.5)

## 2014-01-02 NOTE — Progress Notes (Signed)
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 She was transfered from [facility] with anemia 4.6 requiring transfusion & AKI.  STUDIES:  7/30 - FOBT (Positive)  SIGNIFICANT EVENTS: 01/2013 - ICH > resulting in chronic resp failure with trach status.  7/21 > 7/23 - Admission for anemia requiring transfusion 7/30 - admitted for anemia Hgb 4.6, transfuse PRBC, consulted GI, HyperK to 6.5, worsening AKI 7/31 - Hgb improved to 7.3 (s/p 3u PRBC), K down to 5.3 (s/p kayexalate), Lactic acid 1.4, PCT stable 4.15         - Remains on vent support, resolved bleeding, CXR (inc b/l interstitial opacities edema vs PNA) 8/1 EGD > no clear evidence of bleeding  ANTIBIOTICS: Vancomycin 7/30 x 1 dose Zosyn 7/30 >>  CULTURES: Blood x2 7/30 >>>ng Respiratory 7/30 >>>pseudomonas pan sensitive  LINES/TUBES: Tracheostomy, chronic PIVs  SUBJECTIVE:  Continues to have periods of severe hypoxemia with turning, requiring high level vent support  VITAL SIGNS: Temp:  [95.9 F (35.5 C)-97.4 F (36.3 C)] 97.2 F (36.2 C) (08/04 0850) Pulse Rate:  [55-76] 56 (08/04 0900) Resp:  [0-31] 19 (08/04 0900) BP: (109-132)/(60-75) 114/66 mmHg (08/04 0900) SpO2:  [85 %-95 %] 92 % (08/04 0900) FiO2 (%):  [100 %] 100 % (08/04 0843) HEMODYNAMICS:   VENTILATOR SETTINGS: Vent Mode:  [-] PRVC FiO2 (%):  [100 %] 100 % Set Rate:  [16 bmp] 16 bmp Vt Set:  [450 mL] 450 mL PEEP:  [10 cmH20] 10 cmH20 Plateau Pressure:  [24 cmH20-32 cmH20] 25 cmH20 INTAKE / OUTPUT:  Intake/Output Summary (Last 24 hours) at 01/02/14 0924 Last data filed at 01/02/14 0700  Gross per 24 hour   Intake 1325.66 ml  Output    115 ml  Net 1210.66 ml    PHYSICAL EXAMINATION:  Gen: does not withdrawal to pain,  HEENT: NCAT,  trach PULM: rhonchi bilaterally CV: RRR, S1/S2, cannot assess JVD AB: BS infrequent, soft, nontender, PEG Ext: warm, trace edema, no clubbing, no cyanosis Derm: no rash or skin breakdown Neuro: minimal response to external stimuli   LABS:  CBC  Recent Labs Lab 01/24/2014 0340 01/24/2014 1600 01/01/14 1100  WBC 12.7* 14.5* 13.9*  HGB 7.2* 7.1* 6.4*  HCT 21.8* 21.4* 19.0*  PLT 205 222 212   Coag's  Recent Labs Lab 12/27/2013 1713  APTT 43*  INR 1.66*   BMET  Recent Labs Lab 01/04/2014 0340 12/31/13 0525 12/31/13 0745  NA 142 136* 140  K 4.5 3.3* 3.9  CL 99 84* 92*  CO2 21 33* 21  BUN 166* 141* 160*  CREATININE 4.35* 4.23* 4.73*  GLUCOSE 125* 756* 174*   Electrolytes  Recent Labs Lab 01/20/2014 0340 12/31/13 0525 12/31/13 0745  CALCIUM 8.2* 7.0* 8.0*  MG 2.9*  --   --   PHOS 6.8*  --   --    Sepsis Markers  Recent Labs Lab 11/30/2013 1807 12/19/2013 2003 12/29/13 0400 01/09/2014 0340  LATICACIDVEN 1.27  --  1.4  --   PROCALCITON  --  4.25 4.15 4.25  4.16   ABG  Recent Labs Lab 12/29/13 1742 01/07/2014 0516 12/31/13 0515  PHART 7.280* 7.296*  7.368  PCO2ART 45.1* 43.8 41.0  PO2ART 170.0* 67.6* 59.6*   Liver Enzymes  Recent Labs Lab 12/12/2013 1713  AST 68*  ALT 42*  ALKPHOS 339*  BILITOT 1.1  ALBUMIN 2.0*   Cardiac Enzymes No results found for this basename: TROPONINI, PROBNP,  in the last 168 hours Glucose  Recent Labs Lab 01/01/14 1156 01/01/14 1550 01/01/14 1958 01/01/14 2353 01/02/14 0402 01/02/14 0841  GLUCAP 126* 116* 126* 126* 125* 156*    Imaging No results found.   ASSESSMENT / PLAN:  PULMONARY A: Severe chronic hypoxemic respiratory failure, vent dependent > status unchanged for months, very unlikely to recover HCAP P:   Full vent support, 8cc/kg, RR 15., High PEEP Continue  current vent settings, unable to wean  CARDIOVASCULAR A: HTN, pre-admit> no normotensive Lactate normal - 1.4 P:  Monitor BP, MAP goal >65 Hold home BP meds - Hydralazine, Minoxidil  RENAL A:  AKI> worsening elevated BUN >160, Hyperkalemia - Improved P:   continue 1300 po bicarb tid Agree with  Renal - dialysis would be of no medical benefit  GASTROINTESTINAL A: Acute GI Bleed, with melena - FOBT (positive), EGD neg > currently no sign of bleeding but Hgb continues to trend down PEG dependent GERD P:   Cont Protonix IV q 12h, TF @ 10/h  HEMATOLOGIC A: Acute Blood Loss Anemia, s/p 4u PRBC  Acute on chronic anemia  P:  Monitor CBC daily Transfuse PRBC for Hb <7 VTE prophylaxis - SCDs  INFECTIOUS A: HCAP> pseudomonas P:   Continue zosyn for total 14 days  ENDOCRINE A: DM, Type 2 Hypoglycemia P:   Resistant SSI CBG monitoring q 4 hr, ct D10   NEUROLOGIC A: Persistent vegetative state, s/p ICH Hx Seizures P:   Continue Keppra    GLOBAL: Code Status  - FULL, Palliative consulted (previously established and prior discussions with family)  -ethics consult recommended. We are currently providing non medically beneficial care.    TODAY'S SUMMARY: Need cbc again today. No progress. Will discuss with family who is supposed to come today.   CC time 34 min.  Heber CarolinaBrent Ason Heslin, MD [place] [facility] Pager: [phone] Cell: [phone] If no response, call [phone]    01/02/2014, 9:24 AM

## 2014-01-02 NOTE — Progress Notes (Signed)
**Note De-Identified [place] Greb Obfuscation** Patient tolerated lung recruitment for 2 minutes following desaturation of 79%.  RT to cont. To monitor.

## 2014-01-02 NOTE — Progress Notes (Signed)
[facility]  [name] did not come for a visit today.  I tried calling her but had to leave a message.   Will try again to arrange a family meeting.  Bonnie CarolinaBrent Kaislee Chao, MD [facility] Pager: [phone] Cell: [phone] If no response, call [phone]

## 2014-01-02 NOTE — Progress Notes (Signed)
ANTIBIOTIC CONSULT NOTE - FOLLOW UP  Pharmacy Consult for Zosyn Indication: Pseudomonas PNA  Allergies  Allergen Reactions  . Calcium Channel Blockers Other (See Comments)    unkown  . Haldol [Haloperidol Lactate] Other (See Comments)    unknown  . Hctz [Hydrochlorothiazide] Other (See Comments)    unknown  . Lopressor [Metoprolol Tartrate] Other (See Comments)    unknown    Patient Measurements: Height: 5' 1.81" (157 cm) Weight: 207 lb 14.3 oz (94.3 kg) IBW/kg (Calculated) : 49.67  Vital Signs: Temp: 97.2 F (36.2 C) (08/04 0850) Temp src: Axillary (08/04 0850) BP: 122/81 mmHg (08/04 1136) Pulse Rate: 86 (08/04 1136) Intake/Output from previous day: 08/03 0701 - 08/04 0700 In: 1365.7 [I.V.:220; Blood:108.3; NG/GT:637.3; IV Piggyback:400] Out: 115 [Urine:65; Stool:50] Intake/Output from this shift:    Labs:  Recent Labs  01/07/2014 1600 12/31/13 0525 12/31/13 0745 01/01/14 1100 01/02/14 1000  WBC 14.5*  --   --  13.9*  --   HGB 7.1*  --   --  6.4*  --   PLT 222  --   --  212  --   CREATININE  --  4.23* 4.73*  --  5.46*   Estimated Creatinine Clearance: 12 ml/min (by C-G formula based on Cr of 5.46). No results found for this basename: VANCOTROUGH, Leodis BinetVANCOPEAK, VANCORANDOM, GENTTROUGH, GENTPEAK, GENTRANDOM, TOBRATROUGH, TOBRAPEAK, TOBRARND, AMIKACINPEAK, AMIKACINTROU, AMIKACIN,  in the last 72 hours   Microbiology: Recent Results (from the past 720 hour(s))  URINE CULTURE     Status: None   Collection Time    12/19/13  3:43 PM      Result Value Ref Range Status   Specimen Description URINE, CATHETERIZED   Final   Special Requests NONE   Final   Culture  Setup Time     Final   Value: 12/19/2013 17:50     Performed at [facility]   Colony Count     Final   Value: >=100,000 COLONIES/ML     Performed at [facility]   Culture     Final   Value: Multiple bacterial morphotypes present, none predominant. Suggest appropriate recollection if  clinically indicated.     Performed at [facility]   Report Status 12/20/2013 FINAL   Final  CULTURE, RESPIRATORY (NON-EXPECTORATED)     Status: None   Collection Time    12/19/13  3:44 PM      Result Value Ref Range Status   Specimen Description TRACHEAL ASPIRATE   Final   Special Requests NONE   Final   Gram Stain     Final   Value: RARE WBC PRESENT, PREDOMINANTLY MONONUCLEAR     RARE SQUAMOUS EPITHELIAL CELLS PRESENT     FEW GRAM NEGATIVE RODS     RARE GRAM POSITIVE RODS     RARE GRAM POSITIVE COCCI IN PAIRS   Culture     Final   Value: Non-Pathogenic Oropharyngeal-type Flora Isolated.     Performed at [facility]   Report Status 12/22/2013 FINAL   Final  MRSA PCR SCREENING     Status: None   Collection Time    12/19/13  3:50 PM      Result Value Ref Range Status   MRSA by PCR NEGATIVE  NEGATIVE Final   Comment:            The GeneXpert MRSA Assay (FDA     approved for NASAL specimens     only), is one component of a  comprehensive MRSA colonization     surveillance program. It is not     intended to diagnose MRSA     infection nor to guide or     monitor treatment for     MRSA infections.  CULTURE, BLOOD (ROUTINE X 2)     Status: None   Collection Time    12/19/13  5:10 PM      Result Value Ref Range Status   Specimen Description BLOOD LEFT HAND   Final   Special Requests BOTTLES DRAWN AEROBIC ONLY 3.5CC   Final   Culture  Setup Time     Final   Value: 12/19/2013 22:19     Performed at [facility]   Culture     Final   Value: NO GROWTH 5 DAYS     Performed at [facility]   Report Status 12/25/2013 FINAL   Final  CULTURE, BLOOD (ROUTINE X 2)     Status: None   Collection Time    12/19/13  5:15 PM      Result Value Ref Range Status   Specimen Description BLOOD ARM LEFT   Final   Special Requests BOTTLES DRAWN AEROBIC ONLY 6CC   Final   Culture  Setup Time     Final   Value: 12/19/2013 22:20     Performed at [facility]   Culture     Final   Value: NO GROWTH 5 DAYS     Performed at [facility]   Report Status 12/25/2013 FINAL   Final  CULTURE, BLOOD (ROUTINE X 2)     Status: None   Collection Time    12/05/2013  5:09 PM      Result Value Ref Range Status   Specimen Description BLOOD RIGHT PICC LINE   Final   Special Requests BOTTLES DRAWN AEROBIC AND ANAEROBIC 3CC   Final   Culture  Setup Time     Final   Value: 12/29/2013 00:27     Performed at [facility]   Culture     Final   Value:        BLOOD CULTURE RECEIVED NO GROWTH TO DATE CULTURE WILL BE HELD FOR 5 DAYS BEFORE ISSUING A FINAL NEGATIVE REPORT     Performed at [facility]   Report Status PENDING   Incomplete  CULTURE, RESPIRATORY (NON-EXPECTORATED)     Status: None   Collection Time    12/07/2013  6:00 PM      Result Value Ref Range Status   Specimen Description TRACHEAL ASPIRATE   Final   Special Requests Normal   Final   Gram Stain     Final   Value: ABUNDANT WBC PRESENT,BOTH PMN AND MONONUCLEAR     RARE SQUAMOUS EPITHELIAL CELLS PRESENT     MODERATE GRAM NEGATIVE RODS     Performed at [facility]   Culture     Final   Value: ABUNDANT PSEUDOMONAS AERUGINOSA     Performed at [facility]   Report Status 01/01/2014 FINAL   Final   Organism ID, Bacteria PSEUDOMONAS AERUGINOSA   Final  CULTURE, BLOOD (ROUTINE X 2)     Status: None   Collection Time    12/29/2013  7:22 PM      Result Value Ref Range Status   Specimen Description BLOOD HAND LEFT   Final   Special Requests BOTTLES DRAWN AEROBIC AND ANAEROBIC 5CC   Final   Culture  Setup Time     Final   Value: 12/29/2013 00:28     Performed at [facility]   Culture     Final   Value:        BLOOD CULTURE RECEIVED NO GROWTH TO DATE CULTURE WILL BE HELD FOR 5 DAYS BEFORE ISSUING A FINAL NEGATIVE REPORT     Performed at [facility]   Report Status PENDING   Incomplete    Anti-infectives   Start     Dose/Rate Route  Frequency Ordered Stop   2014/01/27 1300  piperacillin-tazobactam (ZOSYN) IVPB 2.25 g     2.25 g 100 mL/hr over 30 Minutes Intravenous 4 times per day January 27, 2014 1218     12/09/2013 1830  vancomycin (VANCOCIN) 2,000 mg in sodium chloride 0.9 % 500 mL IVPB     2,000 mg 250 mL/hr over 120 Minutes Intravenous  Once 12/27/2013 1758 12/15/2013 2112   12/18/2013 1800  vancomycin (VANCOCIN) 4,000 mg in sodium chloride 0.9 % 500 mL IVPB  Status:  Discontinued     20 mg/kg  200 kg (Order-Specific) 250 mL/hr over 120 Minutes Intravenous  Once 12/02/2013 1758 12/04/2013 1758   12/11/2013 1800  piperacillin-tazobactam (ZOSYN) IVPB 3.375 g     3.375 g 100 mL/hr over 30 Minutes Intravenous  Once 12/05/2013 1758 12/10/2013 1957      Assessment: 58 y/o female from [facility] admittted for PNA and progressive azotemia. She continues on Zosyn day 4 for Pseudomonas PNA. Planned treatment is 14 days. Patient is afebrile, WBC were elevated on 8/3, SCr continues to increase, no UOP recorded.  Goal of Therapy:  Eradication of infection  Plan:  - Continue Zosyn 2.25 g IV q6h - Stop date should be morning of 8/15  [facility], [address]D., BCPS Clinical Pharmacist Pager: [phone] 01/02/2014 12:28 PM

## 2014-01-03 DIAGNOSIS — K922 Gastrointestinal hemorrhage, unspecified: Secondary | ICD-10-CM | POA: Diagnosis not present

## 2014-01-03 DIAGNOSIS — K921 Melena: Secondary | ICD-10-CM | POA: Diagnosis not present

## 2014-01-03 DIAGNOSIS — K1370 Unspecified lesions of oral mucosa: Secondary | ICD-10-CM

## 2014-01-03 LAB — CBC WITH DIFFERENTIAL/PLATELET
BASOS PCT: 0 % (ref 0–1)
Basophils Absolute: 0 10*3/uL (ref 0.0–0.1)
Eosinophils Absolute: 0.1 10*3/uL (ref 0.0–0.7)
Eosinophils Relative: 1 % (ref 0–5)
HCT: 24.3 % — ABNORMAL LOW (ref 36.0–46.0)
HEMOGLOBIN: 7.9 g/dL — AB (ref 12.0–15.0)
LYMPHS PCT: 5 % — AB (ref 12–46)
Lymphs Abs: 0.5 10*3/uL — ABNORMAL LOW (ref 0.7–4.0)
MCH: 28.5 pg (ref 26.0–34.0)
MCHC: 32.5 g/dL (ref 30.0–36.0)
MCV: 87.7 fL (ref 78.0–100.0)
MONO ABS: 0.2 10*3/uL (ref 0.1–1.0)
MONOS PCT: 2 % — AB (ref 3–12)
NEUTROS ABS: 8.4 10*3/uL — AB (ref 1.7–7.7)
NEUTROS PCT: 92 % — AB (ref 43–77)
Platelets: 228 10*3/uL (ref 150–400)
RBC: 2.77 MIL/uL — ABNORMAL LOW (ref 3.87–5.11)
RDW: 19.7 % — ABNORMAL HIGH (ref 11.5–15.5)
WBC: 9.2 10*3/uL (ref 4.0–10.5)

## 2014-01-03 LAB — GLUCOSE, CAPILLARY
GLUCOSE-CAPILLARY: 113 mg/dL — AB (ref 70–99)
GLUCOSE-CAPILLARY: 134 mg/dL — AB (ref 70–99)
Glucose-Capillary: 141 mg/dL — ABNORMAL HIGH (ref 70–99)
Glucose-Capillary: 152 mg/dL — ABNORMAL HIGH (ref 70–99)
Glucose-Capillary: 156 mg/dL — ABNORMAL HIGH (ref 70–99)
Glucose-Capillary: 161 mg/dL — ABNORMAL HIGH (ref 70–99)

## 2014-01-03 LAB — BASIC METABOLIC PANEL
ANION GAP: 27 — AB (ref 5–15)
BUN: 167 mg/dL — ABNORMAL HIGH (ref 6–23)
CHLORIDE: 94 meq/L — AB (ref 96–112)
CO2: 24 meq/L (ref 19–32)
CREATININE: 5.56 mg/dL — AB (ref 0.50–1.10)
Calcium: 8.2 mg/dL — ABNORMAL LOW (ref 8.4–10.5)
GFR calc Af Amer: 9 mL/min — ABNORMAL LOW (ref >90)
GFR calc non Af Amer: 8 mL/min — ABNORMAL LOW (ref >90)
Glucose, Bld: 147 mg/dL — ABNORMAL HIGH (ref 70–99)
POTASSIUM: 3.5 meq/L — AB (ref 3.7–5.3)
Sodium: 145 mEq/L (ref 137–147)

## 2014-01-03 MED ORDER — LEVETIRACETAM 100 MG/ML PO SOLN
500.0000 mg | Freq: Two times a day (BID) | ORAL | Status: DC
  Administered 2014-01-03 – 2014-01-07 (×9): 500 mg
  Filled 2014-01-03 (×11): qty 5

## 2014-01-03 MED ORDER — PANTOPRAZOLE SODIUM 40 MG PO PACK
40.0000 mg | PACK | Freq: Two times a day (BID) | ORAL | Status: DC
  Administered 2014-01-03 – 2014-01-07 (×9): 40 mg
  Filled 2014-01-03 (×11): qty 20

## 2014-01-03 NOTE — Care Management Note (Signed)
    Page 1 of 2   01/04/2014     9:50:38 AM CARE MANAGEMENT NOTE 01/04/2014  Patient:  [name],[name]   Account Number:  [id number]  Date Initiated:  12/29/2013  Documentation initiated by:  [name],Bonnie Clutter  Subjective/Objective Assessment:   Readmitted with recurrent GIB - from [facility]     Action/Plan:   Anticipated DC Date:  01/02/2014   Anticipated DC Plan:  SKILLED NURSING FACILITY  In-house referral  Clinical Social Worker      DC Planning Services  CM consult      Choice offered to / List presented to:             Status of service:  In process, will continue to follow Medicare Important Message given?  YES (If response is "NO", the following Medicare IM given date fields will be blank) Date Medicare IM given:  01/01/2014 Medicare IM given by:  [facility]Bonnie Date Additional Medicare IM given:  01/04/2014 Additional Medicare IM given by:  [facility] [name]  Discharge Disposition:    Per UR Regulation:  Reviewed for med. 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  Not ready for vent/SNF discharge. Daughter, [name], in room.  Has spent the night here.  Discussed discharge options if not [facility].  States have tried to take her home and that just didn't work, really does not want [facility] - ok with out of state faciltiy.  SW updated.  Updated that we are looking for Vent/SNF options but at this time only have [facility].  Understands that when physician deems ready for discharge will have to choose from bed offers on table, even if only [facility].  States she does understands - IM given and explained again to daughter.   01-03-14 8:45am Bonnie ArenasSarah Ninel [name], RNBSN- [phone] Talked with daughter [name] - she is in [place]  [name]. Confirmed that most of our conversations have been with [name] as she lives in [name].  Prior to [employer] [name] had tried to reach [facility], but no answer so message was left.  Suggested to [name] that we would like to do face to face but we could maybe to a phone conversation with both daughter at the same time.  [name] will talk to [facility] and coordinate a time they could both do this.  CM will continue to follow. [name] called back - is coming today - will be after 2pm. Cannot give an exact time.  She will talk with her sister to see is she can at least be on the phone for this conversation.  01/01/2014 Additional Medicare IM given. UR completed. Bonnie DonningAlesia Shavis RN CCM Case Mgmt phone [phone]  12-29-13 9:40am Bonnie ArenasSarah Clevie [name], FloridaRNBS[place] [phone] From SNF - [facility].  SW consulte placed.

## 2014-01-03 NOTE — Consult Note (Addendum)
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  Re-admitted for anemia and AKI.     1.  Code Status: Full  2. GOC- [name] is known to me from previous hospitalization. I met her daughter during that admission and was scheduled by my colleague Dr Hilma Favors, to meet with her again today.  She is not at bedside this morning and I am unable to reach her by phone.  I have left a callback number with her.  Challenging situation.  Daughter Vella Redhead has been main point of contact previously.  Bekki's other daughter is Event organiser who lives in [place].  See my previous discussion (12/20/13) they are aware and have been told of Ladona's poor prognosis, but wish to pursue all aggressive measures offered by physicians and have had discussions with other physicians about potentially inappropriate care, which have not gone well per [facility].  Certainly, medical providers such not offer treatments that are not medically indicated or felt to be medically beneficial.  Ethics consultation may be beneficial in negotiating disagreements between family and providers.      3. Symptom Management:   1. Secretions- was previously on [facility] at [facility] which can be reconsidered.  Oral care.   4. Psychosocial: 2 daughters Vella Redhead [phone]- [address] [place] and [place] [phone]- [place])   5. Spiritual: Very spiritual family. Believe that god will take her when it is her time but need to continue to have faith/hope in miracle. Family has good church support at home and have gotten to know spiritual community some in [place] area as well.     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 Patient is transferred to [facility] hospital from [facility] for anemia.  She was recently admitted for same few weeks back.  During this admission no siource of bleeding identified, even after EGD.  Hgb seems to have stabilized here.  AKI with worsening renal function past few days.  Very hight BUN and elevated creatinine.  Renal do not feel she is dialysis candidate.  Acidosis controlled by bicarb tabs.  Family was supposed to meet on at least 2 occasions past few days, but have failed to arrive for meeting.     ROS: Unable to obtain 2/2 MV and cognitive impairment.     PMH:  Past Medical History  Diagnosis Date  . Chronic respiratory failure   . Tracheostomy status   . Persist vegetative state   . 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 Type II diabetes mellitus   . HTN (hypertension)   . Anemia, unspecified   . CKD (chronic kidney disease) stage 2, GFR 60-89 ml/min   . Corneal abscess     history of   . Gastro-esophageal reflux   . Chronic constipation   . 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 Tobacco abuse      PSH: Past Surgical History  Procedure Laterality Date  . Peg tube placement      unknown  insertion date   . Tracheostomy      unknown insertion site  . Esophagogastroduodenoscopy N/A 01/10/2014    Procedure: ESOPHAGOGASTRODUODENOSCOPY (EGD);  Surgeon: Gatha Mayer, MD;  Location: [facility] ENDOSCOPY;  Service: Endoscopy;  Laterality: N/A;   I have reviewed the [place] and SH and  If appropriate update it with new information. Allergies  Allergen Reactions  . Calcium Channel Blockers Other (See Comments)    unkown  . Haldol [Haloperidol Lactate] Other (See Comments)    unknown  . Hctz [Hydrochlorothiazide] Other (See Comments)    unknown  . Lopressor [Metoprolol Tartrate] Other (See Comments)    unknown   Scheduled Meds: . sodium chloride   Intravenous Once  . antiseptic  oral rinse  7 mL Mouth Rinse QID  . chlorhexidine  15 mL Mouth Rinse BID  . insulin aspart  0-20 Units Subcutaneous 6 times per day  . levETIRAcetam  500 mg Intravenous Q12H  . pantoprazole (PROTONIX) IV  40 mg Intravenous Q12H  . piperacillin-tazobactam (ZOSYN)  IV  2.25 g Intravenous 4 times per day  . sodium bicarbonate  1,300 mg Oral TID  . sodium chloride  10-40 mL Intracatheter Q12H   Continuous Infusions: . sodium chloride 10 mL/hr at 01/03/14 0800  . feeding supplement (VITAL HIGH PROTEIN) 1,000 mL (01/03/14 0800)   PRN Meds:.sodium chloride, fentaNYL, sodium chloride    BP 145/70  Pulse 52  Temp(Src) 96.7 F (35.9 C) (Oral)  Resp 21  Ht 5' 1.81" (1.57 m)  Wt 94.6 kg (208 lb 8.9 oz)  BMI 38.38 kg/m2  SpO2 94%   PPS:30   Intake/Output Summary (Last 24 hours) at 01/03/14 0949 Last data filed at 01/03/14 0900  Gross per 24 hour  Intake   1765 ml  Output    370 ml  Net   1395 ml    Physical Exam:  General: Trach with MV. NAD HEENT:  [place], copious secretions Chest:   Scattered coarse sounds CVS: ild bradycardia Abdomen:soft, ND, +PEG Ext: no c/ce Neuro: does not track with eeys, no spontaneous eye movements, does not withdraw to pain for me.   Labs: CBC    Component Value Date/Time   WBC 9.2 01/03/2014 0500   RBC 2.77* 01/03/2014 0500   RBC 2.10* 12/19/2013 1710   HGB 7.9* 01/03/2014 0500   HCT 24.3* 01/03/2014 0500   PLT 228 01/03/2014 0500   MCV 87.7 01/03/2014 0500   MCH 28.5 01/03/2014 0500   MCHC 32.5 01/03/2014 0500   RDW 19.7* 01/03/2014 0500   LYMPHSABS 0.5* 01/03/2014 0500   MONOABS 0.2 01/03/2014 0500   EOSABS 0.1 01/03/2014 0500   BASOSABS 0.0 01/03/2014 0500    BMET    Component Value Date/Time   NA 145 01/03/2014 0500   K 3.5* 01/03/2014 0500   CL 94* 01/03/2014 0500   CO2 24 01/03/2014 0500   GLUCOSE 147* 01/03/2014 0500   BUN 167* 01/03/2014 0500   CREATININE 5.56* 01/03/2014 0500   CALCIUM 8.2* 01/03/2014 0500   GFRNONAA 8* 01/03/2014 0500   GFRAA 9* 01/03/2014 0500     CMP     Component Value Date/Time   NA 145 01/03/2014 0500   K 3.5* 01/03/2014 0500   CL 94* 01/03/2014 0500   CO2 24 01/03/2014 0500   GLUCOSE 147* 01/03/2014 0500   BUN 167* 01/03/2014 0500   CREATININE 5.56* 01/03/2014 0500   CALCIUM 8.2* 01/03/2014 0500   PROT 7.9 12/02/2013 1713   ALBUMIN 2.0* 12/06/2013  1713   AST 68* 12/22/2013 1713   ALT 42* 11/29/2013 1713   ALKPHOS 339* 12/19/2013 1713   BILITOT 1.1 12/03/2013 1713   GFRNONAA 8* 01/03/2014 0500   GFRAA 9* 01/03/2014 0500     Time In: 900 Time Out: 945 Total Time: 45 minutes  Greater than 50%  of this time was spent counseling and coordinating care related to the above assessment and plan. Discussion with Dr Lake Bells.     Doran Clay D.O. Palliative Medicine Team at [facility]  Pager: [phone] Team Phone: [phone]

## 2014-01-03 NOTE — Progress Notes (Signed)
Clinical Social Work Department BRIEF PSYCHOSOCIAL ASSESSMENT 01/03/2014  Patient:  Su GrandCADE,Denetta     Account Number:  [id number]     Admit date:  12/08/2013  Clinical Social Worker:  [name],Rayya Yagi, LCSWA  Date/Time:  01/03/2014 02:19 PM  Referred by:  Physician  Date Referred:  01/03/2014 Referred for  SNF Placement   Other Referral:   Interview type:  Other - See comment Other interview type:   CSW [facility]    PSYCHOSOCIAL DATA Living Status:  FACILITY Admitted from facility:   Level of care:  Skilled Nursing Facility Primary support name:  Theone StanleyMeisha Young Primary support relationship to patient:  CHILD, ADULT Degree of support available:   Pt's daughters live out of town and are not able to provide 24hr care.    CURRENT CONCERNS Current Concerns  Post-Acute Placement   Other Concerns:    SOCIAL WORK ASSESSMENT / PLAN Covering CSW observed that [facility] CSW informed by [facility] that pt's daughter had previously stated that she did not want pt returning to [facility]. CSW also informed that daughters will have a palliative care meeting today. CSW spoke with unit Director regarding dc plans. CSW informed that pt will need to return to the facility if family prefers aggressive care. Daughters to work with CSW at the facility to arrange a change in facility.    CSW will await the outcome from the palliative care meeting to determine disposition for pt. If family requests another facility then CSW will discuss case with supervisor as placement for a vent SNF can be a very lengthy process.    CSW [facility]David [facility] [phone] who informed CSW that the facility has had some concerns with pt as the family has not been agreeable to discuss code status as they are hopeful that pt will recover. Onalee HuaDavid confirmed that [facility] can accept the pt back once a bed becomes available in the next day or so.    CSW has  discussed the above with RNCM, palliative care MD and unit director.   Assessment/plan status:  Psychosocial Support/Ongoing Assessment of Needs Other assessment/ plan:   Information/referral to community resources:   CSW awaiting to follow to determine the resources that will be needed, if any.    PATIENT'S/FAMILY'S RESPONSE TO PLAN OF CARE: Pt unable to participate in assessment. CSW to discuss disposition plans once goals of care have been confirmed.       Theresia BoughNorma Caymen Dubray, MSW, LCSW [phone]

## 2014-01-03 NOTE — Progress Notes (Signed)
Covering CSW spoke with [name] [phone] regarding pt being able to return at discharge. [name] shared that the facility has had challenges with the family not wanting to address code status and having unrealistic expectations on pt's progress.  [name] informed CSW that the daughter has continued to request for the pt to be brought to the hospital. [name] states the facility is agreeable to accepting pt back however would like for the family to have a clear understanding that there are no other interventions at this point that the facility/hospital can do.  [facility] is agreeable to accept the pt back at discharge and once a bed is available at the facility. [name] expects a bed to become available in the next 1-2 days.  CSW to await the outcome of the palliative care meeting.   Theresia BoughNorma Zoeya [name], MSW, LCSW [phone]

## 2014-01-03 NOTE — Progress Notes (Addendum)
Patient [name]      DOB: 1956/05/13      EXB:[id number]  Stopped by patient room at 345PM. Daughter still not here. Will try to reconnect tomorrow.   Orvis BrillAaron J. Ajeenah Heiny D.O. Palliative Medicine Team at [facility]  Pager: [phone] Team Phone: [phone]

## 2014-01-03 NOTE — Progress Notes (Signed)
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 She was transfered from [facility] with anemia 4.6 requiring transfusion & AKI.  STUDIES:  7/30 - FOBT (Positive)  SIGNIFICANT EVENTS: 01/2013 - ICH > resulting in chronic resp failure with trach status.  7/21 > 7/23 - Admission for anemia requiring transfusion 7/30 - admitted for anemia Hgb 4.6, transfuse PRBC, consulted GI, HyperK to 6.5, worsening AKI 7/31 - Hgb improved to 7.3 (s/p 3u PRBC), K down to 5.3 (s/p kayexalate), Lactic acid 1.4, PCT stable 4.15         - Remains on vent support, resolved bleeding, CXR (inc b/l interstitial opacities edema vs PNA) 8/1 EGD > no clear evidence of bleeding  ANTIBIOTICS: Vancomycin 7/30 x 1 dose Zosyn 7/30 >>  CULTURES: Blood x2 7/30 >>>ng Respiratory 7/30 >>>pseudomonas pan sensitive  LINES/TUBES: Tracheostomy, chronic PIVs  SUBJECTIVE:  Daughter was unable to make it to the ethics meeting yesterday and called last night again saying she was having car trouble and would probably be unable to make it in today.  Patient continues to have periods of severe hypoxemia with turning, requiring high level vent support.   VITAL SIGNS: Temp:  [96 F (35.6 C)-97.2 F (36.2 C)] 96.7 F (35.9 C) (08/05 0400) Pulse Rate:  [46-112] 51 (08/05 0600) Resp:  [14-28] 18 (08/05 0600) BP: (106-129)/(59-81) 128/64 mmHg (08/05 0600) SpO2:  [85 %-97 %] 95 % (08/05 0600) FiO2 (%):  [100 %] 100 % (08/05 0600) Weight:  [207 lb 14.3 oz (94.3 kg)-208 lb 8.9 oz (94.6 kg)] 208 lb 8.9 oz (94.6 kg) (08/05 0500) HEMODYNAMICS:   VENTILATOR SETTINGS: Vent Mode:  [-] PRVC FiO2 (%):   [100 %] 100 % Set Rate:  [16 bmp] 16 bmp Vt Set:  [450 mL] 450 mL PEEP:  [10 cmH20] 10 cmH20 Plateau Pressure:  [22 cmH20-32 cmH20] 22 cmH20 INTAKE / OUTPUT:  Intake/Output Summary (Last 24 hours) at 01/03/14 0700 Last data filed at 01/03/14 0600  Gross per 24 hour  Intake   1710 ml  Output    370 ml  Net   1340 ml    PHYSICAL EXAMINATION:  Gen: does not withdrawal to pain,  HEENT: NCAT,  trach PULM: rhonchi bilaterally CV: bradycardia with regular rhythm, S1/S2, cannot assess JVD AB: BS infrequent, soft, nontender, PEG Ext: warm, edema +2, no clubbing, no cyanosis Derm: no rash or skin breakdown Neuro: minimal response to external stimuli   LABS:  CBC  Recent Labs Lab 01/01/14 1100 01/02/14 1200 01/03/14 0500  WBC 13.9* 11.3* 9.2  HGB 6.4* 7.8* 7.9*  HCT 19.0* 23.3* 24.3*  PLT 212 237 228   Coag's  Recent Labs Lab 2014/01/17 1713  APTT 43*  INR 1.66*   BMET  Recent Labs Lab 12/31/13 0745 01/02/14 1000 01/03/14 0500  NA 140 144 145  K 3.9 3.6* 3.5*  CL 92* 93* 94*  CO2 21 24 24   BUN 160* 165* 167*  CREATININE 4.73* 5.46* 5.56*  GLUCOSE 174* 181* 147*   Electrolytes  Recent Labs Lab 01/03/2014 0340  12/31/13 0745 01/02/14 1000 01/03/14 0500  CALCIUM 8.2*  < > 8.0* 8.3* 8.2*  MG 2.9*  --   --   --   --   PHOS 6.8*  --   --   --   --   < > = values in this interval not displayed. Sepsis Markers  Recent Labs Lab Jul 15, 2013 1807 Jul 15, 2013 2003 12/29/13 0400 01/03/2014 0340  LATICACIDVEN 1.27  --  1.4  --   PROCALCITON  --  4.25 4.15 4.25  4.16   ABG  Recent Labs Lab 12/29/13 1742 01/05/2014 0516 12/31/13 0515  PHART 7.280* 7.296* 7.368  PCO2ART 45.1* 43.8 41.0  PO2ART 170.0* 67.6* 59.6*   Liver Enzymes  Recent Labs Lab Jul 15, 2013 1713  AST 68*  ALT 42*  ALKPHOS 339*  BILITOT 1.1  ALBUMIN 2.0*   Cardiac Enzymes No results found for this basename: TROPONINI, PROBNP,  in the last 168 hours Glucose  Recent Labs Lab  01/02/14 0841 01/02/14 1154 01/02/14 1625 01/02/14 1916 01/02/14 2340 01/03/14 0352  GLUCAP 156* 182* 188* 188* 180* 141*    Imaging No results found.   ASSESSMENT / PLAN:  PULMONARY A: Severe chronic hypoxemic respiratory failure, vent dependent > status unchanged for months, very unlikely to recover HCAP P:   Full vent support, 8cc/kg, RR 16., High PEEP10. O2 100% Continue current vent settings, unable to wean  CARDIOVASCULAR A: HTN, pre-admit> no normotensive Lactate normal - 1.4 Bradycardia> due to acidosis? P:  Monitor BP, MAP goal >65 Hold home BP meds - Hydralazine, Minoxidil  RENAL A:  AKI> worsening elevated BUN >160, increasing Creatinine 5.56 Hypokalemia - Improved P:   continue 1300 po bicarb tid Agree with  Renal - dialysis would be of no medical benefit  GASTROINTESTINAL A: Acute GI Bleed, with melena - FOBT (positive), EGD neg > currently no sign of bleeding but Hgb continues to trend down PEG dependent GERD P:   Cont Protonix IV q 12h, TF @ 10/h  HEMATOLOGIC A: Acute Blood Loss Anemia, s/p 4u PRBC  Acute on chronic anemia  P:  Monitor CBC daily Transfuse PRBC for Hb <7 VTE prophylaxis - SCDs  INFECTIOUS A: HCAP> pseudomonas P:   Continue zosyn for total 14 days- (day 5 of 14)  ENDOCRINE A: DM, Type 2 Hypoglycemia P:   Resistant SSI CBG monitoring q 4 hr, ct D10   NEUROLOGIC A: Persistent vegetative state, s/p ICH Hx Seizures P:   Continue Keppra    GLOBAL: Code Status  - FULL, Palliative consulted (previously established and prior discussions with family)  -ethics consult recommended. We are currently providing non medically beneficial care.      TODAY'S SUMMARY: No change in patient status overnight. Daughter unable to meet with ethics committee yesterday and unable to be reached by phone. Last night she made contact with nursing staff to say she would most likely not be able to come in today either due to the  car acting up.     Verdia KubaJennifer Beard PA-S  Attending:  I have seen and examined the patient with nurse practitioner/resident and agree with the note above.   We were disappointed that her daughter did not show up yesterday for our meeting.  I was able to speak with her on the phone and she feels that her mother needs "two more months" to recover because she believes that she can recover.  However, the patient has not made any meaningful neurologic recovery in several months.  Further, she has multi organ failure with severe lung and renal disease. She will not recover from this  illness and we are currently providing medically non-beneficial care.  I feel that we should NOT provide CPR and we should consider withdrawal of all form of medical support.  Agree with ethics committee.  Out of respect for family I have not written DNR order until we are able to meet with them in person.  Hopefully they will show up today.  Greatly appreciate palliative medicine support.  CC time 35 minutes  Heber Steward, MD [place] [facility] Pager: [phone] Cell: [phone] If no response, call [phone]   01/03/2014, 7:00 AM

## 2014-01-04 DIAGNOSIS — J9620 Acute and chronic respiratory failure, unspecified whether with hypoxia or hypercapnia: Secondary | ICD-10-CM

## 2014-01-04 DIAGNOSIS — J80 Acute respiratory distress syndrome: Secondary | ICD-10-CM

## 2014-01-04 DIAGNOSIS — K922 Gastrointestinal hemorrhage, unspecified: Secondary | ICD-10-CM | POA: Diagnosis not present

## 2014-01-04 DIAGNOSIS — J9589 Other postprocedural complications and disorders of respiratory system, not elsewhere classified: Secondary | ICD-10-CM

## 2014-01-04 DIAGNOSIS — K921 Melena: Secondary | ICD-10-CM | POA: Diagnosis not present

## 2014-01-04 LAB — CULTURE, BLOOD (ROUTINE X 2)
CULTURE: NO GROWTH
Culture: NO GROWTH

## 2014-01-04 LAB — GLUCOSE, CAPILLARY
GLUCOSE-CAPILLARY: 110 mg/dL — AB (ref 70–99)
GLUCOSE-CAPILLARY: 129 mg/dL — AB (ref 70–99)
Glucose-Capillary: 119 mg/dL — ABNORMAL HIGH (ref 70–99)
Glucose-Capillary: 129 mg/dL — ABNORMAL HIGH (ref 70–99)
Glucose-Capillary: 166 mg/dL — ABNORMAL HIGH (ref 70–99)
Glucose-Capillary: 157 mg/dL — ABNORMAL HIGH (ref 70–99)

## 2014-01-04 LAB — BASIC METABOLIC PANEL
Anion gap: 30 — ABNORMAL HIGH (ref 5–15)
BUN: 169 mg/dL — AB (ref 6–23)
CALCIUM: 8.4 mg/dL (ref 8.4–10.5)
CO2: 23 mEq/L (ref 19–32)
CREATININE: 5.57 mg/dL — AB (ref 0.50–1.10)
Chloride: 93 mEq/L — ABNORMAL LOW (ref 96–112)
GFR, EST AFRICAN AMERICAN: 9 mL/min — AB (ref >90)
GFR, EST NON AFRICAN AMERICAN: 8 mL/min — AB (ref >90)
Glucose, Bld: 122 mg/dL — ABNORMAL HIGH (ref 70–99)
Potassium: 3.5 mEq/L — ABNORMAL LOW (ref 3.7–5.3)
Sodium: 146 mEq/L (ref 137–147)

## 2014-01-04 NOTE — Progress Notes (Signed)
Performed a recruitment maneuver of PC 35, rr10, PEEP of 5, Ti 3.0sec.

## 2014-01-04 NOTE — Progress Notes (Signed)
Patient [name]      DOB: [birth year]      OVZ:[id number]   Palliative Medicine Team at [facility] Progress Note    Subjective: More hypoxia overnight and episodes of arrythmia as well.  UOP down. Exam remains consistent with PVS and unable to obtain ROS.   Met with daughter [name] this morning, see details below.     Spoke with resp.  Increased PEEP to 12 with subsequent elevation in peak and plateau pressures.  FiO2 100%.  In speaking with ICU attending, concern for ARDS.       Filed Vitals:   01/04/14 0838  BP:   Pulse:   Temp: 96.8 F (36 C)  Resp:    Physical exam: General: Trach with MV. Increased resp rate HEENT: [place], copious secretions  Chest: Scattered coarse sounds, overbreathing vent  CVS: RRR Abdomen: +PEG  Ext: no c/ce  Neuro: does not track with eyes, no spontaneous eye movements, does not withdraw to pain for me.     CBC    Component Value Date/Time   WBC 9.2 01/03/2014 0500   RBC 2.77* 01/03/2014 0500   RBC 2.10* 12/19/2013 1710   HGB 7.9* 01/03/2014 0500   HCT 24.3* 01/03/2014 0500   PLT 228 01/03/2014 0500   MCV 87.7 01/03/2014 0500   MCH 28.5 01/03/2014 0500   MCHC 32.5 01/03/2014 0500   RDW 19.7* 01/03/2014 0500   LYMPHSABS 0.5* 01/03/2014 0500   MONOABS 0.2 01/03/2014 0500   EOSABS 0.1 01/03/2014 0500   BASOSABS 0.0 01/03/2014 0500     CMP     Component Value Date/Time   NA 146 01/04/2014 0500   K 3.5* 01/04/2014 0500   CL 93* 01/04/2014 0500   CO2 23 01/04/2014 0500   GLUCOSE 122* 01/04/2014 0500   BUN 169* 01/04/2014 0500   CREATININE 5.57* 01/04/2014 0500   CALCIUM 8.4 01/04/2014 0500   PROT 7.9 12/07/2013 1713   ALBUMIN 2.0* 12/02/2013 1713   AST 68* 12/22/2013 1713   ALT 42* 12/01/2013 1713   ALKPHOS 339* 12/20/2013 1713   BILITOT 1.1 12/20/2013 1713   GFRNONAA 8* 01/04/2014 0500   GFRAA 9* 01/04/2014 0500       Assessment and plan: 58 yo female with DM, HTN, 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 Re-admitted for anemia and AKI.   1. Code  Status:   2. GOC- See initial consult note. Able to meet with [name] today. Unable to reach [name] by phone. Reviewed current medical situation with [name] and she has received periodic updates as well. Discussed 2 of the major issues facing [name] currently.  Her worsening renal disease, renal deemed not to be dialysis candidate, and worsening respiratory status with increased hypoxia and increased vent settings with at least near maximal ventilatory support.  This is also in the setting of some ectopy overnight as well.  [name] has also had recurrent anemia without identifiable source (despite EGD this admit), increasing frequency of respiratory infections, etc.  Discussed that if we encountered a situation where she "coded" it would be potentially inappropriate medical treatment given her underlying renal function (not dialysis candidate), and near maximal vent support.  [name] balanced this against her hopes for miracle, sisters previously stated desire for [name] to be full code.  I recommend strongly against escelating vent support and to make [name] DNR which [name] agreed with. She requested the opportunity to talk with her sister before final decision and asked I give her 28  minutes to do so.  Unable to reach sister myself.    [name] expressed desire for mom to make it out 1 year from initial CVA event (01/2013).  States spiritual beliefs and belief in miracle as reasons to wait a year.  Had promised herslef that when this initially happened and she received poor prognostic information from other medical providers.   She agrees that current situation is different and code situation may be god calling to take her.    Spoke with [name] again later in day.  We both have attempted to call [name] multiple times and not received response.  [name] in agreement with DNR order and not escelating ventilator support.  Individual decisions about appropriateness of ongoing care should be discussed with family, but  [name] aware that [name] is high risk for passing away this hospitalization. [name] requests all other forms of support be continued as they are.   3. Symptom Management:  1. Secretions- was [facility] which can be reconsidered. Oral care.         4. Psychosocial: 2 daughters ([name] [phone]- [name], [name] and [name] [phone]- [name])         5. Spiritual: Very spiritual family. Believe that god will take her when it is her time but need to continue to have faith/hope in miracle. Family has good church support at home and have gotten to know spiritual community some in [place] area as well.  - At family request have placed consult for spiritual care.       Time In: 930  Time Out: 1020 Total Time: 50 minutes   Greater than 50% of this time was spent counseling and coordinating care related to the above assessment and plan. Discussion with Dr Ramaswamy  Aaron J. Lampkin D.O.   Palliative Medicine Team at [facility]  Pager: [phone]  Team Phone: [phone]     

## 2014-01-04 NOTE — Progress Notes (Signed)
ANTIBIOTIC CONSULT NOTE - FOLLOW UP  Pharmacy Consult for Zosyn  Indication: Pseudomonas PNA   Pharmacy signing off as renal function worsening with no plans for HD. Continue Zosyn 2.25 g IV q6h  Stop date should be morning of 8/15 to complete 14 days  [facility]urgicenter LLCJennifer Carl [name], VermontPharm.D., BCPS Clinical Pharmacist Pager: [phone] 01/04/2014 10:35 AM

## 2014-01-04 NOTE — Progress Notes (Signed)
Chaplain responded to spiritual care consult. Pt's daughter present and very talkative. Daughter and chaplain spoke in the waiting area as daughter shared the strains of her mother's illness on her life. She stated that [place] care for [place] and that she "has become a different person," exhausted and unable to hold her previous job. Daughter shared that other relatives have not been as supportive as she would like and that she feels she is carrying most of the burden of caring for her mother alone.   Despite these difficulties, daughter maintains that her mother "will be healed." Daughter shared about several instances in which strangers have shared prophecies about her mother recovering. These messages conflict with the information from the doctors, who do not believe pt will improve.  Daughter also shared that her mother was independent, stubborn, and determined to "live her life." Chaplain pointed out how the pt's current condition does not allow her to do that. Daughter agreed that "her quality of life is horrible," and that the pt's illness has also limited the daughter's ability to enjoy her own life. Still, the daughter is convinced of pt's imminent healing.   Daughter does, however, appreciate prayer for strength and divine guidance. Chaplain available for follow-up as needed.

## 2014-01-04 NOTE — Progress Notes (Signed)
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 She was transfered from [facility] with anemia 4.6 requiring transfusion & AKI.  STUDIES:  7/30 - FOBT (Positive)  SIGNIFICANT EVENTS: 01/2013 - ICH > resulting in chronic resp failure with trach status.  7/21 > 7/23 - Admission for anemia requiring transfusion 7/30 - admitted for anemia Hgb 4.6, transfuse PRBC, consulted GI, HyperK to 6.5, worsening AKI 7/31 - Hgb improved to 7.3 (s/p 3u PRBC), K down to 5.3 (s/p kayexalate), Lactic acid 1.4, PCT stable 4.15         - Remains on vent support, resolved bleeding, CXR (inc b/l interstitial opacities edema vs PNA) 8/1 EGD > no clear evidence of bleeding 01/03/14:" daughter was unable to make it to the ethics meeting yesterday and called last night again saying she was having car trouble and would probably be unable to make it in today.  Patient continues to have periods of severe hypoxemia with turning, requiring high level vent support.     ANTIBIOTICS: Vancomycin 7/30 x 1 dose Zosyn 7/30 >>  CULTURES: Blood x2 7/30 >>>ng Respiratory 7/30 >>>pseudomonas pan sensitive  LINES/TUBES: Tracheostomy, chronic PIVs  SUBJECTIVE:  01/04/14: Per nursing report daughter Vella Redhead, was met by palliative care this morning in waiting area and asked for a few minutes then  Could not be found. No change in status; still on 100% fio2 and peep 12   DVITAL SIGNS: Temp:  [92.2 F (33.4 C)-97.8 F (36.6 C)] 96.8 F (36 C) (08/06 6834) Pulse Rate:  [49-85] 73 (08/06 0800) Resp:  [13-35] 24 (08/06 0800) BP: (123-229)/(59-103) 149/71 mmHg (08/06 0800) SpO2:  [88 %-96 %] 89 %  (08/06 0800) FiO2 (%):  [100 %] 100 % (08/06 0800) Weight:  [95 kg (209 lb 7 oz)] 95 kg (209 lb 7 oz) (08/06 0500) HEMODYNAMICS:   VENTILATOR SETTINGS: Vent Mode:  [-] PRVC FiO2 (%):  [100 %] 100 % Set Rate:  [16 bmp] 16 bmp Vt Set:  [450 mL] 450 mL PEEP:  [10 cmH20-12 cmH20] 12 cmH20 Plateau Pressure:  [20 cmH20-33 cmH20] 33 cmH20 INTAKE / OUTPUT:  Intake/Output Summary (Last 24 hours) at 01/04/14 0849 Last data filed at 01/04/14 0800  Gross per 24 hour  Intake   1555 ml  Output    315 ml  Net   1240 ml    PHYSICAL EXAMINATION:  Gen: does not withdrawal to pain,  HEENT: NCAT,  trach PULM: rhonchi bilaterally CV: bradycardia with regular rhythm, S1/S2, cannot assess JVD AB: BS infrequent, soft, nontender, PEG Ext: warm, edema +2, no clubbing, no cyanosis Derm: no rash or skin breakdown Neuro: minimal response to external stimuli   LABS:  CBC  Recent Labs Lab 01/01/14 1100 01/02/14 1200 01/03/14 0500  WBC 13.9* 11.3* 9.2  HGB 6.4* 7.8* 7.9*  HCT 19.0* 23.3* 24.3*  PLT 212 237 228   Coag's  Recent Labs Lab 12/19/2013 1713  APTT 43*  INR 1.66*   BMET  Recent Labs Lab 01/02/14 1000 01/03/14 0500 01/04/14 0500  NA 144 145 146  K 3.6* 3.5* 3.5*  CL 93* 94* 93*  CO2 _0 BUN  165* 167* 169*  CREATININE 5.46* 5.56* 5.57*  GLUCOSE 181* 147* 122*   Electrolytes  Recent Labs Lab 01/25/2014 0340  01/02/14 1000 01/03/14 0500 01/04/14 0500  CALCIUM 8.2*  < > 8.3* 8.2* 8.4  MG 2.9*  --   --   --   --   PHOS 6.8*  --   --   --   --   < > = values in this interval not displayed. Sepsis Markers  Recent Labs Lab 12/16/2013 1807 12/03/2013 2003 12/29/13 0400 01/01/2014 0340  LATICACIDVEN 1.27  --  1.4  --   PROCALCITON  --  4.25 4.15 4.25  4.16   ABG  Recent Labs Lab 12/29/13 1742 01/18/2014 0516 12/31/13 0515  PHART 7.280* 7.296* 7.368  PCO2ART 45.1* 43.8 41.0  PO2ART 170.0* 67.6* 59.6*   Liver Enzymes  Recent Labs Lab 12/11/2013 1713   AST 68*  ALT 42*  ALKPHOS 339*  BILITOT 1.1  ALBUMIN 2.0*   Cardiac Enzymes No results found for this basename: TROPONINI, PROBNP,  in the last 168 hours Glucose  Recent Labs Lab 01/03/14 1201 01/03/14 1545 01/03/14 1942 01/03/14 2353 01/04/14 0337 01/04/14 0835  GLUCAP 134* 152* 161* 156* 129* 119*    Imaging No results found.   ASSESSMENT / PLAN:  PULMONARY A: Severe chronic hypoxemic respiratory failure, vent dependent > status unchanged for months, very unlikely to recover HCAP   - ARDS physiology continues P:   Full vent support, 8cc/kg, RR 16., High PEEP10. O2 100% Continue current vent settings, unable to wean  CARDIOVASCULAR A: HTN, pre-admit   - Not on pressors  P:  Monitor BP, MAP goal >65 Hold home BP meds - Hydralazine, Minoxidil  RENAL A:  Acute renal failureI> worsening elevated BUN >160, increasing Creatinine 5.56 Hypokalemia     - made 40cc urine overnight  P:   continue 1300 po bicarb tid Agree with  Renal - dialysis would be of no medical benefit  GASTROINTESTINAL A: Acute GI Bleed, with melena - FOBT (positive), EGD neg > currently no sign of bleeding but Hgb continues to trend down PEG dependent GERD   - no active gi bleed  P:   Cont Protonix IV q 12h, TF @ 10/h  HEMATOLOGIC A: Acute Blood Loss Anemia, s/p 4u PRBC  Acute on chronic anemia  P:  Monitor CBC daily Transfuse PRBC for Hb <7 VTE prophylaxis - SCDs  INFECTIOUS A: HCAP> pseudomonas P:   Continue zosyn for total 14 days- (day 6 of 14)  ENDOCRINE A: DM, Type 2 Hypoglycemia P:   Resistant SSI CBG monitoring q 4 hr, ct D10   NEUROLOGIC A: Persistent vegetative state, s/p ICH Hx Seizures P:   Continue Keppra    GLOBAL: Code Status  - FULL, Palliative consulted (previously established and prior discussions with family)  -ethics consult recommended. We are currently providing non medically beneficial care.      TODAY'S SUMMARY: Still  unable to have meaningful discussion with daughter; she left after agreeing to meet with palliative care. PCCM and health care team continuing to provide medically ineffective care; need alignment with family. Appreciate palliative care input.      The patient is critically ill with multiple organ systems failure and requires high complexity decision making for assessment and support, frequent evaluation and titration of therapies, application of advanced monitoring technologies and extensive interpretation of multiple databases.   Critical Care Time devoted to patient care services described in this note is  35  Minutes.  Dr. Brand Males, M.D., [facility].C.P Pulmonary and Critical Care Medicine Staff Physician [place] [facility] Pager: [phone], If no answer or between  15:00h - 7:00h: call [phone]  01/04/2014 8:58 AM

## 2014-01-04 NOTE — Progress Notes (Signed)
UR Completed.  [name], [name] Jane [phone] 01/04/2014

## 2014-01-05 DIAGNOSIS — K922 Gastrointestinal hemorrhage, unspecified: Secondary | ICD-10-CM | POA: Diagnosis not present

## 2014-01-05 DIAGNOSIS — Z515 Encounter for palliative care: Secondary | ICD-10-CM

## 2014-01-05 DIAGNOSIS — Z66 Do not resuscitate: Secondary | ICD-10-CM

## 2014-01-05 DIAGNOSIS — K921 Melena: Secondary | ICD-10-CM | POA: Diagnosis not present

## 2014-01-05 LAB — GLUCOSE, CAPILLARY
GLUCOSE-CAPILLARY: 148 mg/dL — AB (ref 70–99)
GLUCOSE-CAPILLARY: 133 mg/dL — AB (ref 70–99)
GLUCOSE-CAPILLARY: 104 mg/dL — AB (ref 70–99)
Glucose-Capillary: 120 mg/dL — ABNORMAL HIGH (ref 70–99)
Glucose-Capillary: 144 mg/dL — ABNORMAL HIGH (ref 70–99)
Glucose-Capillary: 153 mg/dL — ABNORMAL HIGH (ref 70–99)

## 2014-01-05 NOTE — Progress Notes (Addendum)
Patient [name]:[name]:[name]      DOB: [birth year]      JYN:[id number]   [facility]    Subjective: More hypxic episodes overnight.  Requiring PRN fentanyl 2/2 tachypnea and vent dysynchrony.    Unable to obtain ROS 2/2 PVS     Filed Vitals:   01/05/14 0800  BP: 139/123  Pulse: 68  Temp:   Resp: 19   Physical exam: General: Trach with MV. Some diaphoresis. On  Warming blanket HEENT: [place], secretions better today Chest: Scattered coarse sounds, overbreathing vent  CVS: RRR  Abdomen: +PEG  Ext: no c/ce  Neuro: does not track with eyes, no spontaneous eye movements, does not withdraw to pain for me.    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 Re-admitted for anemia and AKI.   1. Code Status:  DNR  2. GOC- See initial consult note. As well as GOC discussion from 01/04/14.  Bonnie MatesMeisha is reportedly still in hospital, but I am unable to locate her or reach her via phone on 2 separate occassions.   Voice mail full and unable to leave message.  I will attempt to stop by this afternoon.  [name] had worsening hypoxia overnight and sating in low 70's this morning with continued elevation in peak/plateua pressures with aggressive vent management.  Certainly concerning for worsening ARDS and consistent with terminal trajectory.  Will attempt to discuss care further with [facility] and family (still unable to reach sister [name]).  Their goal remains to give as much time for a miracle and I suspect it would be difficult decision for them to agree with full comfort care.  Large [facility] that we should not pursue resuscitation.         3. Symptom Management:  1. Secretions- was previously on [facility] patch at [facility] which can be reconsidered. Oral care. Better today 2. Dyspnea/Vent dysynchrony- Fentanyl PRN 3.        4. Psychosocial: 2 daughters [name]([name] [phone]- [address]  and [name] [phone]- [place])   5. Spiritual: Very spiritual family. Believe that god will take her when it is her time but need to continue to have faith/hope in miracle. Family has good church support at home and have gotten [place] spiritual community [place] as well.  - appreciate support of pastoral care.     Orvis BrillAaron J. Analeya Luallen D.O. [facility]  Pager: [phone] Team Phone: [phone]

## 2014-01-05 NOTE — Progress Notes (Signed)
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 She was transfered from [facility] with anemia 4.6 requiring transfusion & AKI.  STUDIES:  7/30 - FOBT (Positive)  ANTIBIOTICS: Vancomycin 7/30 x 1 dose Zosyn 7/30 >>  CULTURES: Blood x2 7/30 >>>ng Respiratory 7/30 >>>pseudomonas pan sensitive  LINES/TUBES: Tracheostomy, chronic PIVs   SIGNIFICANT EVENTS: 01/2013 - ICH > resulting in chronic resp failure with trach status.  7/21 > 7/23 - Admission for anemia requiring transfusion 7/30 - admitted for anemia Hgb 4.6, transfuse PRBC, consulted GI, HyperK to 6.5, worsening AKI 7/31 - Hgb improved to 7.3 (s/p 3u PRBC), K down to 5.3 (s/p kayexalate), Lactic acid 1.4, PCT stable 4.15         - Remains on vent support, resolved bleeding, CXR (inc b/l interstitial opacities edema vs PNA) 8/1 EGD > no clear evidence of bleeding 01/03/14:" daughter was unable to make it to the ethics meeting yesterday and called last night again saying she was having car trouble and would probably be unable to make it in today.  Patient continues to have periods of severe hypoxemia with turning, requiring high level vent support.  01/04/14:  daughter Vella Redhead, was met by palliative care  -> DNAR but full medical care    SUBJECTIVE/OVERNIGHT/INTERVAL HX 01/05/14: change in code status noted. Clinically severe ARDS persists   DVITAL SIGNS: Temp:  [96.3 F (35.7 C)-99.3 F (37.4 C)] 96.3 F (35.7 C) (08/07 0757) Pulse Rate:  [30-89] 68 (08/07 0800) Resp:  [19-34] 19 (08/07 0800) BP: (139-180)/(59-123) 139/123 mmHg (08/07 0800) SpO2:  [70 %-91 %] 76 % (08/07 0800) FiO2 (%):   [100 %] 100 % (08/07 0800) Weight:  [95.7 kg (210 lb 15.7 oz)] 95.7 kg (210 lb 15.7 oz) (08/07 0500) HEMODYNAMICS:   VENTILATOR SETTINGS: Vent Mode:  [-] PRVC FiO2 (%):  [100 %] 100 % Set Rate:  [16 bmp] 16 bmp Vt Set:  [450 mL] 450 mL PEEP:  [12 cmH20] 12 cmH20 Plateau Pressure:  [32 cmH20-35 cmH20] 34 cmH20 INTAKE / OUTPUT:  Intake/Output Summary (Last 24 hours) at 01/05/14 0938 Last data filed at 01/05/14 0800  Gross per 24 hour  Intake   1785 ml  Output    290 ml  Net   1495 ml    PHYSICAL EXAMINATION:  Gen: does not withdrawal to pain,  HEENT: NCAT,  trach PULM: dysnch with vent CV: bradycardia with regular rhythm, S1/S2, cannot assess JVD AB: BS infrequent, soft, nontender, PEG Ext: warm, edema +2, no clubbing, no cyanosis Derm: no rash or skin breakdown Neuro: minimal response to external stimuli, GCS 3   LABS:  CBC  Recent Labs Lab 01/01/14 1100 01/02/14 1200 01/03/14 0500  WBC 13.9* 11.3* 9.2  HGB 6.4* 7.8* 7.9*  HCT 19.0* 23.3* 24.3*  PLT 212 237 228   Coag's No results found for this basename: APTT, INR,  in the last 168 hours BMET  Recent Labs Lab 01/02/14 1000 01/03/14 0500 01/04/14 0500  NA 144 145 146  K 3.6* 3.5* 3.5*  CL 93* 94* 93*  CO2 24 24 23   BUN 165* 167* 169*  CREATININE 5.46* 5.56* 5.57*  GLUCOSE  181* 147* 122*   Electrolytes  Recent Labs Lab 01/17/2014 0340  01/02/14 1000 01/03/14 0500 01/04/14 0500  CALCIUM 8.2*  < > 8.3* 8.2* 8.4  MG 2.9*  --   --   --   --   PHOS 6.8*  --   --   --   --   < > = values in this interval not displayed. Sepsis Markers  Recent Labs Lab 01/01/2014 0340  PROCALCITON 4.25  4.16   ABG  Recent Labs Lab 12/29/13 1742 01/20/2014 0516 12/31/13 0515  PHART 7.280* 7.296* 7.368  PCO2ART 45.1* 43.8 41.0  PO2ART 170.0* 67.6* 59.6*   Liver Enzymes No results found for this basename: AST, ALT, ALKPHOS, BILITOT, ALBUMIN,  in the last 168 hours Cardiac Enzymes No results found for this  basename: TROPONINI, PROBNP,  in the last 168 hours Glucose  Recent Labs Lab 01/04/14 1126 01/04/14 1547 01/04/14 1946 01/04/14 2346 01/05/14 0349 01/05/14 0754  GLUCAP 129* 166* 157* 110* 120* 144*    Imaging No results found.   ASSESSMENT / PLAN:  PULMONARY A: Severe chronic hypoxemic respiratory failure, vent dependent > status unchanged for months, very unlikely to recover HCAP   - SEvere ARDS physiology continues and worse 01/05/14  P:   Full vent support, 8cc/kg, RR 16., High PEEP12. O2 100% Continue current vent settings, unable to wean; increase peep to 14  CARDIOVASCULAR A: HTN, pre-admit   - Not on pressors  P:  Monitor BP, MAP goal >65 Hold home BP meds - Hydralazine, Minoxidil  RENAL A:  Acute renal failureI> worsening elevated BUN >160, increasing Creatinine 5.56 Hypokalemia     - made 290ccc urine x 24h  P:   continue 1300 po bicarb tid Agree with  Renal - dialysis would be of no medical benefit  GASTROINTESTINAL A: Acute GI Bleed, with melena - FOBT (positive), EGD neg > currently no sign of bleeding but Hgb continues to trend down PEG dependent GERD   - no active gi bleed  P:   Cont Protonix IV q 12h, TF @ 10/h  HEMATOLOGIC A: Acute Blood Loss Anemia, s/p 4u PRBC  Acute on chronic anemia  P:  Monitor CBC daily Transfuse PRBC for Hb <7 VTE prophylaxis - SCDs  INFECTIOUS A: HCAP> pseudomonas P:   Continue zosyn for total 14 days- (day 7 of 14)  ENDOCRINE A: DM, Type 2 Hypoglycemia P:   Resistant SSI CBG monitoring q 4 hr, ct D10   NEUROLOGIC A: Persistent vegetative state, s/p ICH Hx Seizures P:   Continue Keppra    GLOBAL: Code Status  - DNAR but full medical care as of 01/04/14 onwards. Daughter Vella Redhead slowly accepting that this admission is terminal admission. Still full medical care    The patient is critically ill with multiple organ systems failure and requires high complexity decision making for  assessment and support, frequent evaluation and titration of therapies, application of advanced monitoring technologies and extensive interpretation of multiple databases.   Critical Care Time devoted to patient care services described in this note is  35  Minutes.  Dr. Brand Males, M.D., [facility].C.P Pulmonary and Critical Care Medicine Staff Physician [place] [facility] and Critical [facility] Pager: [phone], If no answer or between  15:00h - 7:00h: call [phone]  01/05/2014 9:38 AM

## 2014-01-06 ENCOUNTER — Inpatient Hospital Stay (HOSPITAL_COMMUNITY): Payer: Medicare Other

## 2014-01-06 DIAGNOSIS — K922 Gastrointestinal hemorrhage, unspecified: Secondary | ICD-10-CM | POA: Diagnosis not present

## 2014-01-06 DIAGNOSIS — K921 Melena: Secondary | ICD-10-CM | POA: Diagnosis not present

## 2014-01-06 LAB — BASIC METABOLIC PANEL
ANION GAP: 34 — AB (ref 5–15)
BUN: 178 mg/dL — ABNORMAL HIGH (ref 6–23)
CALCIUM: 9 mg/dL (ref 8.4–10.5)
CO2: 21 mEq/L (ref 19–32)
CREATININE: 6.18 mg/dL — AB (ref 0.50–1.10)
Chloride: 91 mEq/L — ABNORMAL LOW (ref 96–112)
GFR calc non Af Amer: 7 mL/min — ABNORMAL LOW (ref >90)
GFR, EST AFRICAN AMERICAN: 8 mL/min — AB (ref >90)
Glucose, Bld: 127 mg/dL — ABNORMAL HIGH (ref 70–99)
Potassium: 3.8 mEq/L (ref 3.7–5.3)
Sodium: 146 mEq/L (ref 137–147)

## 2014-01-06 LAB — CBC WITH DIFFERENTIAL/PLATELET
Basophils Absolute: 0 10*3/uL (ref 0.0–0.1)
Basophils Relative: 0 % (ref 0–1)
EOS ABS: 0.2 10*3/uL (ref 0.0–0.7)
Eosinophils Relative: 1 % (ref 0–5)
HCT: 24.3 % — ABNORMAL LOW (ref 36.0–46.0)
Hemoglobin: 7.8 g/dL — ABNORMAL LOW (ref 12.0–15.0)
LYMPHS PCT: 4 % — AB (ref 12–46)
Lymphs Abs: 0.7 10*3/uL (ref 0.7–4.0)
MCH: 28.2 pg (ref 26.0–34.0)
MCHC: 32.1 g/dL (ref 30.0–36.0)
MCV: 87.7 fL (ref 78.0–100.0)
Monocytes Absolute: 0.7 10*3/uL (ref 0.1–1.0)
Monocytes Relative: 4 % (ref 3–12)
NEUTROS PCT: 91 % — AB (ref 43–77)
Neutro Abs: 15.1 10*3/uL — ABNORMAL HIGH (ref 1.7–7.7)
Platelets: 225 10*3/uL (ref 150–400)
RBC: 2.77 MIL/uL — ABNORMAL LOW (ref 3.87–5.11)
RDW: 21.2 % — ABNORMAL HIGH (ref 11.5–15.5)
WBC: 16.7 10*3/uL — ABNORMAL HIGH (ref 4.0–10.5)

## 2014-01-06 LAB — GLUCOSE, CAPILLARY
GLUCOSE-CAPILLARY: 166 mg/dL — AB (ref 70–99)
Glucose-Capillary: 120 mg/dL — ABNORMAL HIGH (ref 70–99)
Glucose-Capillary: 146 mg/dL — ABNORMAL HIGH (ref 70–99)
Glucose-Capillary: 174 mg/dL — ABNORMAL HIGH (ref 70–99)
Glucose-Capillary: 177 mg/dL — ABNORMAL HIGH (ref 70–99)
Glucose-Capillary: 86 mg/dL (ref 70–99)

## 2014-01-06 MED ORDER — SCOPOLAMINE 1 MG/3DAYS TD PT72
1.0000 | MEDICATED_PATCH | TRANSDERMAL | Status: DC
  Administered 2014-01-06: 1.5 mg via TRANSDERMAL
  Filled 2014-01-06 (×2): qty 1

## 2014-01-06 NOTE — Progress Notes (Signed)
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 She was transfered from [facility] with anemia 4.6 requiring transfusion & AKI.  STUDIES:  7/30 - FOBT (Positive)  ANTIBIOTICS: Vancomycin 7/30 x 1 dose Zosyn 7/30 >>  CULTURES: Blood x2 7/30 >>>ng Respiratory 7/30 >>>pseudomonas pan sensitive  LINES/TUBES: Tracheostomy, chronic PIVs   SIGNIFICANT EVENTS: 01/2013 - ICH > resulting in chronic resp failure with trach status.  7/21 > 7/23 - Admission for anemia requiring transfusion 7/30 - admitted for anemia Hgb 4.6, transfuse PRBC, consulted GI, HyperK to 6.5, worsening AKI 7/31 - Hgb improved to 7.3 (s/p 3u PRBC), K down to 5.3 (s/p kayexalate), Lactic acid 1.4, PCT stable 4.15         - Remains on vent support, resolved bleeding, CXR (inc b/l interstitial opacities edema vs PNA) 8/1 EGD > no clear evidence of bleeding 01/03/14:" daughter was unable to make it to the ethics meeting yesterday and called last night again saying she was having car trouble and would probably be unable to make it in today.  Patient continues to have periods of severe hypoxemia with turning, requiring high level vent support.  01/04/14:  daughter Vella Redhead, was met by palliative care  -> DNAR but full medical care  01/05/14: change in code status noted. Clinically severe ARDS persists    SUBJECTIVE/OVERNIGHT/INTERVAL HX 01/06/14: very severe ARDS persists and worsening. She is desaturating to high 60s despite high peep and fio2 settings. BP still good. Family on way: and changing code status to full code. Worsening renal failure  DVITAL SIGNS: Temp:  [97.3 F (36.3 C)-98.6 F  (37 C)] 97.3 F (36.3 C) (08/08 0733) Pulse Rate:  [64-121] 121 (08/08 0800) Resp:  [14-32] 28 (08/08 0800) BP: (144-194)/(70-92) 170/92 mmHg (08/08 0800) SpO2:  [69 %-94 %] 72 % (08/08 0800) FiO2 (%):  [100 %] 100 % (08/08 0800) Weight:  [96.1 kg (211 lb 13.8 oz)] 96.1 kg (211 lb 13.8 oz) (08/08 0500) HEMODYNAMICS:   VENTILATOR SETTINGS: Vent Mode:  [-] PRVC FiO2 (%):  [100 %] 100 % Set Rate:  [16 bmp] 16 bmp Vt Set:  [450 mL] 450 mL PEEP:  [12 cmH20-15 cmH20] 15 cmH20 Plateau Pressure:  [34 cmH20-38 cmH20] 36 cmH20 INTAKE / OUTPUT:  Intake/Output Summary (Last 24 hours) at 01/06/14 0906 Last data filed at 01/06/14 0800  Gross per 24 hour  Intake   1465 ml  Output    330 ml  Net   1135 ml    PHYSICAL EXAMINATION:  Gen: does not withdrawal to pain,  HEENT: NCAT,  trach PULM: dysnch with vent CV: bradycardia with regular rhythm, S1/S2, cannot assess JVD AB: BS infrequent, soft, nontender, PEG Ext: warm, edema +2, no clubbing, no cyanosis Derm: no rash or skin breakdown Neuro: minimal response to external stimuli, GCS 3   LABS:  CBC  Recent Labs Lab 01/02/14 1200 01/03/14 0500 01/06/14 0500  WBC 11.3* 9.2 16.7*  HGB 7.8* 7.9* 7.8*  HCT 23.3* 24.3* 24.3*  PLT 237 228 225   Coag's No results found for this basename: APTT, INR,  in the last 168 hours BMET  Recent Labs Lab 01/03/14 0500  01/04/14 0500 01/06/14 0500  NA 145 146 146  K 3.5* 3.5* 3.8  CL 94* 93* 91*  CO2 24 23 21   BUN 167* 169* 178*  CREATININE 5.56* 5.57* 6.18*  GLUCOSE 147* 122* 127*   Electrolytes  Recent Labs Lab 01/03/14 0500 01/04/14 0500 01/06/14 0500  CALCIUM 8.2* 8.4 9.0   Sepsis Markers No results found for this basename: LATICACIDVEN, PROCALCITON, O2SATVEN,  in the last 168 hours ABG  Recent Labs Lab 12/31/13 0515  PHART 7.368  PCO2ART 41.0  PO2ART 59.6*   Liver Enzymes No results found for this basename: AST, ALT, ALKPHOS, BILITOT, ALBUMIN,  in the last 168  hours Cardiac Enzymes No results found for this basename: TROPONINI, PROBNP,  in the last 168 hours Glucose  Recent Labs Lab 01/05/14 1521 01/05/14 1549 01/05/14 2025 01/06/14 0027 01/06/14 0430 01/06/14 0730  GLUCAP 148* 133* 104* 86 120* 146*    Imaging No results found. - CXR total white out   ASSESSMENT / PLAN:  PULMONARY A: Severe chronic hypoxemic respiratory failure, vent dependent > status unchanged for months, very unlikely to recover HCAP   - SEvere ARDS physiology continues and worse 01/06/14. She will die from this  P:   Full vent support, 8cc/kg, RR 16., High PEEP12. O2 100% Continue current vent settings, unable to wean; increase peep to 14  CARDIOVASCULAR A: HTN, pre-admit   - Not on pressors  P:  Monitor BP, MAP goal >65 Hold home BP meds - Hydralazine, Minoxidil  RENAL A:  Acute renal failureI> worsening elevated BUN >160, increasing Creatinine 5.56 Hypokalemia     - made 330ccc urine x 24h  P:   continue 1300 po bicarb tid Agree with  Renal - dialysis would be of no medical benefit  GASTROINTESTINAL A: Acute GI Bleed, with melena - FOBT (positive), EGD neg > currently no sign of bleeding but Hgb continues to trend down PEG dependent GERD   - no active gi bleed  P:   Cont Protonix IV q 12h, TF @ 10/h  HEMATOLOGIC A: Acute Blood Loss Anemia, s/p 4u PRBC  Acute on chronic anemia  P:  Monitor CBC daily Transfuse PRBC for Hb <7 VTE prophylaxis - SCDs  INFECTIOUS A: HCAP> pseudomonas P:   Continue zosyn for total 14 days- (day 8 of 14)  ENDOCRINE A: DM, Type 2 Hypoglycemia P:   Resistant SSI CBG monitoring q 4 hr, ct D10   NEUROLOGIC A: Persistent vegetative state, s/p ICH Hx Seizures P:   Continue Keppra    GLOBAL: Code Status  - DNAR rescinded by family 01/06/14 over phone on hearing about patient worsening. Family ETA 1h. Palliative care will meet with family again. Full code now. She will die of this  illness. All therapies medically ineffective.    The patient is critically ill with multiple organ systems failure and requires high complexity decision making for assessment and support, frequent evaluation and titration of therapies, application of advanced monitoring technologies and extensive interpretation of multiple databases.   Critical Care Time devoted to patient care services described in this note is  35  Minutes.  Dr. Brand Males, M.D., [facility].C.P Pulmonary and Critical Care Medicine Staff Physician [facility] Pager: [phone], If no answer or between  15:00h - 7:00h: call [phone]  01/06/2014 9:06 AM

## 2014-01-06 NOTE — Progress Notes (Signed)
Daughter, [name], at bedside.  Dr. Greig RightLampkin notified.

## 2014-01-06 NOTE — Progress Notes (Signed)
Patient's O2 sats are remaining in the 60's with a shallow breathing pattern noted.  Respiratory suctioned per protocol with bloody secretions noted. Patient remains on 100% FiO2. Daughter notified of patient's decline in respiratory status and stated she would be here with family shortly.  Palliative care and CCM also notified on patient's current condition.  Will continue to monitor.

## 2014-01-06 NOTE — Progress Notes (Addendum)
Patient [name]      DOB: 1955-12-03      YIF:[id number]   Palliative Medicine Team at [facility] Progress Note    Subjective: More hypoxic this morning with sats in 60's.  Nurse notified daughter [name] appropriately of declining clinical status.  [name] actually had returned home to [place] area.  She informed nurse that she was requesting DNR status be changed back to full code.      Filed Vitals:   01/06/14 1000  BP: 155/69  Pulse: 74  Temp:   Resp: 21   Physical exam: GEN: vent through trach,  HEENT: [place], sclera anicteric CV: RRR with brief episodes of bradycardia LUNGS: scattered coarse sounds, more rapid and shallow respirations ABD: soft, +BS, ND    CBC    Component Value Date/Time   WBC 16.7* 01/06/2014 0500   RBC 2.77* 01/06/2014 0500   RBC 2.10* 12/19/2013 1710   HGB 7.8* 01/06/2014 0500   HCT 24.3* 01/06/2014 0500   PLT 225 01/06/2014 0500   MCV 87.7 01/06/2014 0500   MCH 28.2 01/06/2014 0500   MCHC 32.1 01/06/2014 0500   RDW 21.2* 01/06/2014 0500   LYMPHSABS 0.7 01/06/2014 0500   MONOABS 0.7 01/06/2014 0500   EOSABS 0.2 01/06/2014 0500   BASOSABS 0.0 01/06/2014 0500   CMP     Component Value Date/Time   NA 146 01/06/2014 0500   K 3.8 01/06/2014 0500   CL 91* 01/06/2014 0500   CO2 21 01/06/2014 0500   GLUCOSE 127* 01/06/2014 0500   BUN 178* 01/06/2014 0500   CREATININE 6.18* 01/06/2014 0500   CALCIUM 9.0 01/06/2014 0500   PROT 7.9 12/03/2013 1713   ALBUMIN 2.0* 12/21/2013 1713   AST 68* 12/23/2013 1713   ALT 42* 12/12/2013 1713   ALKPHOS 339* 12/06/2013 1713   BILITOT 1.1 12/27/2013 1713   GFRNONAA 7* 01/06/2014 0500   GFRAA 8* 01/06/2014 0500     Assessment and plan: 58 yo female with DM, HTN, 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 Re-admitted for anemia and AKI.   1. Code Status: Now Full Code again  2. GOC- See previous documentation.  Daughter [name] asked nurse to have code status changed and informed nurse conversation being recorded.  When I spoke with  [name] over the phone, she tells me the reason for change in her opinion is that she was able to reach her sister in [place] and she was adamant that code status not be changed. She also said, that it did not matter if Dr's said that her mom would pass away because they believe "god will heal her". She reports our limitations in medicine and that while she senses our worry, she knows we do not have faith like her and family/mom.  [name] had told nurse she was on her way reportedly, but tells me she is leaving in about 20 minutes and sister from [place] [employer]) also is on her way.       - continues to be challenging situation. I still feel that cardiac resuscitation would be potentially inappropriate treatment for [name], but there is certainly multiple dynamics in play in family pursuing this, including their spiritual beliefs.  Will attempt to meet with them again when they arrive.    3. Symptom Management:  1. Secretions- more copious today. I have re-ordered her scopalamine patch as [facility] at [facility].  2. Dyspnea/Vent dysynchrony- Fentanyl PRN        4. Psychosocial: 2 daughters (  [place],  and [place] [phone]- [place])   5. Spiritual: Clearly of utmost importance to family.  [name] has met with our chaplain service and reports positive experience. She does not wish for them to come back at this time.   ADDENDUM:  [name] at bedside this afternoon, after 2PM.  States her son is with her but he told him to stay in care if he is not going to have faith in god healing [name].  Sister [name] not hear yet.  I offered to meet with her as well and would prefer to attempt goals conversation once more when family is here. I am again unable to reach [name] by phone and have left her a message to contact the hospital.      Doran Clay D.O. Palliative Medicine Team at [facility]  Pager: [phone] Team Phone: [phone]    Addendum: Met with daughter [name] as well as  grandson. Explained medical conditions and serious nature of her illness with main areas of concern being Lungs with ARDS and Kidneys with ongoing hypoxia.  [name] expressed concerns about physicians at other health care institutions not respecting their beliefs, thinking they were selfish, and not respecting their wish to avoid negative topics.  Family believes that God will work Oceanographer and that thinking or speaking negatively may effect those chances.  We were able to have above discussion amicably though as she agreed to hear the concerning medical perspective that makes [place] worry she is high risk of dying in next few hours despite maximized vent support.  We talked about reasons why doing CPR in setting of hypoxia and ARDS would potentially be inappropriate medical care because CPR would not fix the main driver of cardiac arrest which is almost certain to be hypoxia at this point.  [name] stated that she didn't understand why CPR would be considered an uncomfortable procedure as she has heard from others in past.  We talked about this, and also how we never want to put someone through any medical procedure if we did not think it would benefit them medically.  [name] and her grandson were able to exchange some ideas and we also talked a little about how would we know when it is "god's time" to take [name].  [name] and godson admitted that they at least needed to talk about these issues as a family as they admittedly had been avoiding it (driven by spiritual beliefs to avoid negativety). [name] does not agree with recommendation of DNR at least until family has had time to discuss things further.        Time In: 420 Time Out: 515 Total Time: 55 minutes  Doran Clay D.O. Palliative Medicine Team at [facility]  Pager: [phone] Team Phone: [phone]

## 2014-01-06 NOTE — Progress Notes (Signed)
Daugther, [name], called RN to state she was recording conversation.  She stated she wants her mother to be a full code and did not sign papers to agree to a DNR.  Daughter told the physician would be contacted in regards to this matter.  She was also told her mother is on full ventilator support and we are doing everything possible at this time.  Dr. Marchelle Gearingamaswamy notified and palliative care paged.

## 2014-01-07 ENCOUNTER — Inpatient Hospital Stay (HOSPITAL_COMMUNITY): Payer: Medicare Other

## 2014-01-07 DIAGNOSIS — K921 Melena: Secondary | ICD-10-CM | POA: Diagnosis not present

## 2014-01-07 DIAGNOSIS — K922 Gastrointestinal hemorrhage, unspecified: Secondary | ICD-10-CM | POA: Diagnosis not present

## 2014-01-07 LAB — BASIC METABOLIC PANEL
Anion gap: 38 — ABNORMAL HIGH (ref 5–15)
BUN: 187 mg/dL — AB (ref 6–23)
CO2: 20 mEq/L (ref 19–32)
Calcium: 8.9 mg/dL (ref 8.4–10.5)
Chloride: 91 mEq/L — ABNORMAL LOW (ref 96–112)
Creatinine, Ser: 6.43 mg/dL — ABNORMAL HIGH (ref 0.50–1.10)
GFR calc Af Amer: 7 mL/min — ABNORMAL LOW (ref >90)
GFR, EST NON AFRICAN AMERICAN: 6 mL/min — AB (ref >90)
Glucose, Bld: 175 mg/dL — ABNORMAL HIGH (ref 70–99)
Potassium: 4.3 mEq/L (ref 3.7–5.3)
SODIUM: 149 meq/L — AB (ref 137–147)

## 2014-01-07 LAB — CBC WITH DIFFERENTIAL/PLATELET
BASOS ABS: 0 10*3/uL (ref 0.0–0.1)
Basophils Relative: 0 % (ref 0–1)
EOS ABS: 0 10*3/uL (ref 0.0–0.7)
Eosinophils Relative: 0 % (ref 0–5)
HCT: 20.6 % — ABNORMAL LOW (ref 36.0–46.0)
Hemoglobin: 6.7 g/dL — CL (ref 12.0–15.0)
LYMPHS ABS: 0.7 10*3/uL (ref 0.7–4.0)
Lymphocytes Relative: 6 % — ABNORMAL LOW (ref 12–46)
MCH: 28.8 pg (ref 26.0–34.0)
MCHC: 32.5 g/dL (ref 30.0–36.0)
MCV: 88.4 fL (ref 78.0–100.0)
MONO ABS: 0.5 10*3/uL (ref 0.1–1.0)
Monocytes Relative: 4 % (ref 3–12)
Neutro Abs: 10.1 10*3/uL — ABNORMAL HIGH (ref 1.7–7.7)
Neutrophils Relative %: 90 % — ABNORMAL HIGH (ref 43–77)
PLATELETS: 188 10*3/uL (ref 150–400)
RBC: 2.33 MIL/uL — ABNORMAL LOW (ref 3.87–5.11)
RDW: 21.6 % — ABNORMAL HIGH (ref 11.5–15.5)
WBC: 11.3 10*3/uL — AB (ref 4.0–10.5)

## 2014-01-07 LAB — GLUCOSE, CAPILLARY
GLUCOSE-CAPILLARY: 116 mg/dL — AB (ref 70–99)
GLUCOSE-CAPILLARY: 167 mg/dL — AB (ref 70–99)
GLUCOSE-CAPILLARY: 177 mg/dL — AB (ref 70–99)
Glucose-Capillary: 162 mg/dL — ABNORMAL HIGH (ref 70–99)
Glucose-Capillary: 138 mg/dL — ABNORMAL HIGH (ref 70–99)
Glucose-Capillary: 132 mg/dL — ABNORMAL HIGH (ref 70–99)

## 2014-01-07 LAB — PREPARE RBC (CROSSMATCH)

## 2014-01-07 MED ORDER — CISATRACURIUM BOLUS VIA INFUSION
0.1000 mg/kg | Freq: Once | INTRAVENOUS | Status: DC
  Filled 2014-01-07: qty 10

## 2014-01-07 MED ORDER — SODIUM CHLORIDE 0.9 % IV SOLN
25.0000 ug/h | INTRAVENOUS | Status: DC
  Filled 2014-01-07: qty 50

## 2014-01-07 MED ORDER — FENTANYL CITRATE 0.05 MG/ML IJ SOLN: 100.0000 ug | Freq: Once | INTRAMUSCULAR | Status: DC

## 2014-01-07 MED ORDER — ARTIFICIAL TEARS OP OINT: 1.0000 "application " | TOPICAL_OINTMENT | Freq: Three times a day (TID) | OPHTHALMIC | Status: DC

## 2014-01-07 MED ORDER — FENTANYL CITRATE 0.05 MG/ML IJ SOLN: 100.0000 ug | Freq: Once | INTRAMUSCULAR | Status: AC | PRN

## 2014-01-07 MED ORDER — SODIUM CHLORIDE 0.9 % IV SOLN
3.0000 ug/kg/min | INTRAVENOUS | Status: DC
  Filled 2014-01-07: qty 20

## 2014-01-07 MED ORDER — SODIUM CHLORIDE 0.9 % IV SOLN: Freq: Once | INTRAVENOUS | Status: DC

## 2014-01-07 MED ORDER — FENTANYL BOLUS VIA INFUSION
50.0000 ug | INTRAVENOUS | Status: DC | PRN
  Filled 2014-01-07: qty 50

## 2014-01-07 MED ORDER — PROPOFOL 10 MG/ML IV EMUL
5.0000 ug/kg/min | INTRAVENOUS | Status: DC
  Filled 2014-01-07: qty 100

## 2014-01-07 NOTE — Progress Notes (Addendum)
Clinical Social Worker (CSW) spoke with patient's daughter Theone StanleyMeisha Young about the lost car keys. Daughter reported that she put her car keys on a tray and they must have been thrown away by accident. Daughter reported that she has 2 teenage boys at home in [place] and can't afford to get her keys. Per Clinical Orthoptistocial Worker director we can offer daughter a Nurse, mental healthtrain ticket home to [place] from [place]. CSW offered train ticket to daughter and she turned it down and reported that she did not want to leave her car at the hospital. CSW encouraged daughter to follow up with risk management about car keys.CSW will continue to follow and assist as needed.   Jetta LoutBailey Morgan, LCSWA Weekend CSW [phone]

## 2014-01-07 NOTE — Progress Notes (Addendum)
eLink Physician-Brief Progress Note Patient Name: [name] DOB: [birth year] MRN: [id number]  Date of Service  01/07/2014   HPI/Events of Note   Hypoxemia noted. On PEEP 15 and FiO2 of 100%.  eICU Interventions   Trial of APRV. Paralysis. Check X-ray and ABG.    Intervention Category Major Interventions: Hypoxemia - evaluation and management  [name] R. 01/07/2014, 10:44 PM

## 2014-01-07 NOTE — Progress Notes (Signed)
This note also relates to the following rows which could not be included: Rate - Cannot attach notes to extension rows    01/07/14 1315  Vitals  Temp ! 94.4 F (34.7 C)  Pulse Rate ! 53  ECG Heart Rate ! 116  Resp ! 27  BP ! 87/59 mmHg  Oxygen Therapy  SpO2 ! 75 %  Transfuse RBC  Volume 335  Tolerated transfusion well. No problems

## 2014-01-07 NOTE — Progress Notes (Signed)
Patient AT:[facility] [place]      DOB: 1956/03/19      KGU:[id number]   Palliative Medicine Team at [facility] Progress Note    Subjective: Kaidan remains hypoxic and exam consistent with PVS. No ROS with neurologic injury and MV.      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 Re-admitted for anemia and AKI.   1. Code Status: Now Full Code again   2. GOC- See previous documentation, including family meetings from 01/06/14. [name] had indicated that family would meet amongst themselves yesterday, but per [facility], this did not happen and [name] has returned home to [place].  I do not forsee that continued negotiation between medical teams and family will lead to agreement on what appropriate care would be.  Reportedly patients brothers/sisters now coming in from out of town tomorrow.  Given her worsening renal failure/hypoxia and likely ARDS, I agree that full code represents potentially inappropriate treatment.  I would be in support of pursuing inappropriate treatment guidelines and hospital medical futility policy in this regard. This would involve process of consulting ethics committee and if they are unable to negotiate disagreements, looking for potential transfer of care to another physician and/or facility.  I believe that the main driver behind family desires in care revolve around hope for a miracle, and their belief that even talking about potential negative outcome represents a lack of faith.    3. Symptom Management:  1. Secretions- restarted scopolamine patch.   2. Dyspnea/Vent dysynchrony- Fentanyl PRN                                                          4. Psychosocial: 2 daughters Vella Redhead [place], [place] and [place] [phone]- [place])   5. Spiritual: Clearly of utmost importance to family. Vella Redhead has met with our chaplain service and reports positive experience. She does not wish for them to come back at this time.    Time In: 825 Time Out: 850 Total Time: 25 minutes  Doran Clay D.O. Palliative Medicine Team at [facility]  Pager: [phone] Team Phone: [phone]

## 2014-01-07 NOTE — Progress Notes (Addendum)
PULMONARY / CRITICAL CARE MEDICINE   Name: [name] MRN: [id number] DOB: Jul 02, 1955    ADMISSION DATE:  12/27/2013 CONSULTATION DATE:  12/12/2013  REFERRING MD :  [facility] >> EDP  CHIEF COMPLAINT:  Acute anemia, GIB  INITIAL PRESENTATION: 58 yo trach dependent female with recent [place] 12/1186 complicated by respiratory failure. 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 She was transfered from [facility] with anemia 4.6 requiring transfusion & AKI.  STUDIES:  7/30 - FOBT (Positive)  ANTIBIOTICS: Vancomycin 7/30 x 1 dose Zosyn 7/30 >>  CULTURES: Blood x2 7/30 >>>ng Respiratory 7/30 >>>pseudomonas pan sensitive  LINES/TUBES: Tracheostomy, chronic PIVs   SIGNIFICANT EVENTS: 01/2013 - ICH > resulting in chronic resp failure with trach status.  7/21 > 7/23 - Admission for anemia requiring transfusion 7/30 - admitted for anemia Hgb 4.6, transfuse PRBC, consulted GI, HyperK to 6.5, worsening AKI 7/31 - Hgb improved to 7.3 (s/p 3u PRBC), K down to 5.3 (s/p kayexalate), Lactic acid 1.4, PCT stable 4.15         - Remains on vent support, resolved bleeding, CXR (inc b/l interstitial opacities edema vs PNA) 8/1 EGD > no clear evidence of bleeding 01/03/14:" daughter was unable to make it to the ethics meeting yesterday and called last night again saying she was having car trouble and would probably be unable to make it in today.  Patient continues to have periods of severe hypoxemia with turning, requiring high level vent support.  01/04/14:  daughter [name], was met by palliative care  -> DNAR but full medical care 01/05/14: change in code status noted to DNAR . Clinically severe ARDS persists 01/06/14: Back to full code. Worsening ARDS    SUBJECTIVE/OVERNIGHT/INTERVAL HX 01/07/14: severe ards persists.[name] SIGNS: Temp:  [94.4 F (34.7 C)-97.4 F (36.3 C)] 97 F (36.1 C) (08/09 0756) Pulse Rate:  [59-81] 60 (08/09 0756) Resp:  [0-31] 22 (08/09 0756) BP: (122-155)/(58-75)  125/67 mmHg (08/09 0700) SpO2:  [62 %-92 %] 77 % (08/09 0756) FiO2 (%):  [100 %] 100 % (08/09 0756) Weight:  [210 lb 1.6 oz (95.3 kg)] 210 lb 1.6 oz (95.3 kg) (08/09 0500) HEMODYNAMICS:   VENTILATOR SETTINGS: Vent Mode:  [-] PRVC FiO2 (%):  [100 %] 100 % Set Rate:  [16 bmp] 16 bmp Vt Set:  [450 mL] 450 mL PEEP:  [15 cmH20] 15 cmH20 Plateau Pressure:  [35 cmH20-41 cmH20] 41 cmH20 INTAKE / OUTPUT:  Intake/Output Summary (Last 24 hours) at 01/07/14 0805 Last data filed at 01/07/14 0600  Gross per 24 hour  Intake   1410 ml  Output    235 ml  Net   1175 ml    PHYSICAL EXAMINATION:  Gen: does not withdrawal to pain,  HEENT: NCAT,  trach PULM: dysnch with vent CV: bradycardia with regular rhythm, S1/S2, cannot assess JVD AB: BS infrequent, soft, nontender, PEG Ext: warm, edema +2, no clubbing, no cyanosis Derm: no rash or skin breakdown Neuro: minimal response to external stimuli, GCS 3   LABS:  CBC  Recent Labs Lab 01/03/14 0500 01/06/14 0500 01/07/14 0400  WBC 9.2 16.7* 11.3*  HGB 7.9* 7.8* 6.7*  HCT 24.3* 24.3* 20.6*  PLT 228 225 188   Coag's No results found for this basename: APTT, INR,  in the last 168 hours BMET  Recent Labs Lab 01/04/14 0500 01/06/14 0500 01/07/14 0400  NA 146 146 149*  K 3.5* 3.8 4.3  CL 93* 91* 91*  CO2 23 21  20  BUN 169* 178* 187*  CREATININE 5.57* 6.18* 6.43*  GLUCOSE 122* 127* 175*   Electrolytes  Recent Labs Lab 01/04/14 0500 01/06/14 0500 01/07/14 0400  CALCIUM 8.4 9.0 8.9   Sepsis Markers No results found for this basename: LATICACIDVEN, PROCALCITON, O2SATVEN,  in the last 168 hours ABG No results found for this basename: PHART, PCO2ART, PO2ART,  in the last 168 hours Liver Enzymes No results found for this basename: AST, ALT, ALKPHOS, BILITOT, ALBUMIN,  in the last 168 hours Cardiac Enzymes No results found for this basename: TROPONINI, PROBNP,  in the last 168 hours Glucose  Recent Labs Lab  01/06/14 0730 01/06/14 1205 01/06/14 1611 01/06/14 2021 01/07/14 0027 01/07/14 0412  GLUCAP 146* 166* 177* 174* 167* 177*    Imaging Dg Chest Port 1 View  01/06/2014   CLINICAL DATA:  Endotracheal tube.  EXAM: PORTABLE CHEST - 1 VIEW  COMPARISON:  One-view chest 12/31/2013.  FINDINGS: The tracheostomy tube is in place. Thumb right-sided PICC line is stable. The heart is enlarged. Diffuse interstitial and airspace disease has increased bilaterally. Bilateral pleural effusions are suspected.  IMPRESSION: 1. Significant interval increase in diffuse interstitial and airspace disease, most compatible with ARDS. Infection is not excluded. 2. The support apparatus is stable.   Electronically Signed   By: Lawrence Santiago M.D.   On: 01/06/2014 09:46   - CXR total white out   ASSESSMENT / PLAN:  PULMONARY A: Severe chronic hypoxemic respiratory failure, vent dependent > status unchanged for months, very unlikely to recover HCAP   - SEvere ARDS physiology continues and worse 01/07/14. She will die from this  P:   Full vent support, 8cc/kg, RR 16., High PEEP15. O2 100% Continue current vent settings, unable to wean;  CARDIOVASCULAR A: HTN, pre-admit   - Not on pressors  P:  Monitor BP, MAP goal >65 Hold home BP meds - Hydralazine, Minoxidil  RENAL A:  Acute renal failureI> worsening      - made > 200cc urine x 24h  P:   continue 1300 po bicarb tid Agree with  Renal - dialysis would be of no medical benefit  GASTROINTESTINAL A: Acute GI Bleed, with melena - FOBT (positive), EGD neg > currently no sign of bleeding but Hgb continues to trend down PEG dependent GERD   - no active gi bleed  P:   Cont Protonix IV q 12h, TF @ 10/h  HEMATOLOGIC A: Acute Blood Loss Anemia, s/p 4u PRBC  Acute on chronic anemia   - hgb < 7gm%  P:  Monitor CBC daily Transfuse PRBC for Hb <7; repeat PRBC 01/07/14 VTE prophylaxis - SCDs DC phlebotomies to conserve on  blood  INFECTIOUS A: HCAP> pseudomonas P:   Continue zosyn for total 14 days- (day 8 of 14)  ENDOCRINE A: DM, Type 2 Hypoglycemia P:   Resistant SSI CBG monitoring q 4 hr, ct D10   NEUROLOGIC A: Persistent vegetative state, s/p ICH Hx Seizures P:   Continue Keppra    GLOBAL: 01/07/14: Code Status : DNAR rescinded by family 01/06/14 and now full code. Extensive palliative care meetings almost daily basis appreciated in this patient with medically ineffective therapies being provided.  Family still processing and trying to avoid "negative conversations". Daughter [name] updated at bedside    The patient is critically ill with multiple organ systems failure and requires high complexity decision making for assessment and support, frequent evaluation and titration of therapies, application of advanced monitoring technologies and extensive  interpretation of multiple databases.   Critical Care Time devoted to patient care services described in this note is  35  Minutes.  Dr. Brand Males, M.D., [facility].C.P Pulmonary and Critical Care Medicine Staff Physician [place] Pulmonary and Critical Care Pager: [phone], If no answer or between  15:00h - 7:00h: call [phone]  01/07/2014 8:05 AM

## 2014-01-07 NOTE — Progress Notes (Signed)
[facility] ,about pt not tolerating Bi level mode, that her VT and MV had dropped drastically and her sats were in the 70's and the pt had started vomiting .RN told  me to switch her back to previous settings , the doctor was in report ,and would call me back when done.

## 2014-01-07 NOTE — Plan of Care (Signed)
Problem: Phase I Progression Outcomes Goal: Hemodynamically stable Outcome: Progressing Junctional tach vs svt vs afib, SB, NSR. Bradycardia and desaturation with suctioning and turning. Received 1 unit PRBCs today. Goal: Baseline oxygen/pH stable Outcome: Progressing Oxygen saturations 68-90 % on 100 % FiO2 today. Improved greater than 80 after 1 unit PRBCs transfused. Goal: Tracheostomy by Vent Day 14 Outcome: Not Applicable Date Met:  18/98/42 Lurline Idol was in place on admission

## 2014-01-07 NOTE — Progress Notes (Signed)
CRITICAL VALUE ALERT  Critical value received: Hgb 6.7  Date of notification: 01/07/14  Time of notification: 0520  Critical value read back: yes  Nurse who received alert: Brayton CavesJessie, RN  MD notified (1st page): CCM  Time of first page: [phone]

## 2014-01-08 DIAGNOSIS — K922 Gastrointestinal hemorrhage, unspecified: Secondary | ICD-10-CM | POA: Diagnosis not present

## 2014-01-08 DIAGNOSIS — I469 Cardiac arrest, cause unspecified: Secondary | ICD-10-CM

## 2014-01-08 DIAGNOSIS — Z515 Encounter for palliative care: Secondary | ICD-10-CM

## 2014-01-08 DIAGNOSIS — K921 Melena: Secondary | ICD-10-CM | POA: Diagnosis not present

## 2014-01-08 LAB — POCT I-STAT 3, ART BLOOD GAS (G3+)
Acid-base deficit: 13 mmol/L — ABNORMAL HIGH (ref 0.0–2.0)
Bicarbonate: 17.1 mEq/L — ABNORMAL LOW (ref 20.0–24.0)
O2 Saturation: 59 %
PCO2 ART: 59.6 mmHg — AB (ref 35.0–45.0)
PH ART: 7.062 — AB (ref 7.350–7.450)
Patient temperature: 97.6
TCO2: 19 mmol/L (ref 0–100)
pO2, Arterial: 42 mmHg — ABNORMAL LOW (ref 80.0–100.0)

## 2014-01-08 LAB — TYPE AND SCREEN
ABO/RH(D): O POS
Antibody Screen: NEGATIVE
UNIT DIVISION: 0

## 2014-01-08 LAB — GLUCOSE, CAPILLARY
GLUCOSE-CAPILLARY: 24 mg/dL — AB (ref 70–99)
GLUCOSE-CAPILLARY: 41 mg/dL — AB (ref 70–99)
GLUCOSE-CAPILLARY: 82 mg/dL (ref 70–99)
GLUCOSE-CAPILLARY: 93 mg/dL (ref 70–99)
Glucose-Capillary: 41 mg/dL — CL (ref 70–99)
Glucose-Capillary: 46 mg/dL — ABNORMAL LOW (ref 70–99)
Glucose-Capillary: 83 mg/dL (ref 70–99)

## 2014-01-08 MED ORDER — DEXTROSE 50 % IV SOLN
50.0000 mL | Freq: Once | INTRAVENOUS | Status: AC | PRN
  Administered 2014-01-08: 50 mL via INTRAVENOUS

## 2014-01-08 MED ORDER — DEXTROSE 50 % IV SOLN
INTRAVENOUS | Status: AC
  Administered 2014-01-08: 50 mL
  Filled 2014-01-08: qty 50

## 2014-01-08 MED ORDER — DEXTROSE 50 % IV SOLN
INTRAVENOUS | Status: AC
  Filled 2014-01-08: qty 50

## 2014-01-08 MED ORDER — DEXTROSE 10 % IV SOLN
INTRAVENOUS | Status: DC
  Administered 2014-01-08: 50 mL/h via INTRAVENOUS

## 2014-01-08 MED ORDER — SODIUM BICARBONATE 8.4 % IV SOLN
100.0000 meq | Freq: Once | INTRAVENOUS | Status: AC
  Administered 2014-01-08: 100 meq via INTRAVENOUS
  Filled 2014-01-08: qty 100

## 2014-01-08 MED ORDER — SODIUM BICARBONATE 8.4 % IV SOLN
INTRAVENOUS | Status: AC
  Filled 2014-01-08: qty 50

## 2014-01-09 LAB — GLUCOSE, CAPILLARY
GLUCOSE-CAPILLARY: 28 mg/dL — AB (ref 70–99)
GLUCOSE-CAPILLARY: 42 mg/dL — AB (ref 70–99)
GLUCOSE-CAPILLARY: 498 mg/dL — AB (ref 70–99)
Glucose-Capillary: 20 mg/dL — CL (ref 70–99)
Glucose-Capillary: 43 mg/dL — CL (ref 70–99)

## 2014-01-16 NOTE — Discharge Summary (Signed)
NAMWandra Mannan:  [name], Bonnie                  ACCOUNT NO.:  [id number]  MEDICAL RECORD NO.:  [id number]  LOCATION:                                 FACILITY:  PHYSICIAN:  Felipa EvenerWesam Jake Yacoub, MD  DATE OF BIRTH:  September 04, 1955  DATE OF ADMISSION:  12/20/2013 DATE OF DISCHARGE:  01/19/2014                              DISCHARGE SUMMARY   DEATH SUMMARY  PRIMARY DIAGNOSIS/CAUSE OF DEATH:  Severe adult respiratory distress syndrome.  SECONDARY DIAGNOSES:  Chronic respiratory failure, acute renal failure, persistent vegetative state, intracranial hemorrhage, ventilator dependence, acute gastrointestinal bleed with melena, blood loss anemia, pseudomonas, healthcare acquired pneumonia, type 2 diabetes, and seizures.  The patient is a 58 year old female, past medical significant for recent intracranial hemorrhages, year and half ago had an intracranial hemorrhage that left her in a persistent vegetative state.  The patient was trached at that time, [facility].  At [facility], she was noted to have GI bleed and hematuria.  [facility] and subsequently developed ARDS with pseudomonas HCAP.  The patient __________ the ventilator, however, developed refractory septic shock and despite multiple efforts for resuscitation, the patient developed cardiac arrest.  CPR was performed.  The patient __________ circulation but refractory shock remained.  I spoke with the daughter and said further paralytic shock would not be in her mother's best interest.  At which point, the patient was allowed to have her blood pressure drift down, due to __________ septic shock, the patient expired shortly thereafter with the family at the bedside.     Felipa EvenerWesam Jake Yacoub, MD     WJY/MEDQ  D:  01/15/2014  T:  01/15/2014  Job:  [id number]

## 2014-01-30 NOTE — Progress Notes (Signed)
Chaplain responded to code. Pt's daughter speaking on phone throughout duration of code and stated she was "just praying." Daughter not responsive to chaplain support at this time. Chaplain available if her needs change.

## 2014-01-30 NOTE — Progress Notes (Signed)
Patient ZO:XWRUE:[name]      DOB: [birth year]      AVW:[id number]   Code blue event today with 4 minutes of resuscitation efforts.  Pulse restored.  [name] holding vigil at bedside and calling family to pray.  She continues to not want to talk about possibility of [name] dying and awaiting God to work miracle.  Wants to continue with ventilator support.  Dr Molli KnockYacoub and I were present in room and discussed no further attempts at resuscitation or escalation of care.  She agree with this and DNR order.  Suspect her prognosis is in minutes to hours.    Orvis BrillAaron J. Leafy Motsinger D.O. Palliative Medicine Team at [facility]  Pager: [phone] Team Phone: [phone]

## 2014-01-30 NOTE — Procedures (Signed)
CPR Procedure Note  Called in patient is PEA, epi and bicarb pushed, patient remained PEA after 5 minutes of resuscitation.  Given that the patient has no chance at recovery here and that she is not a dialysis candidate and that all care provided is completely ineffective decision was made to stop cardiac resuscitation.  Daughter was present during code.  Alyson ReedyWesam G. Ugochi Henzler, M.D. [facility] Pulmonary/Critical Care Medicine. Pager: [phone]. After hours pager: [phone].

## 2014-01-30 NOTE — Progress Notes (Signed)
Patient [name]      DOB: Oct 17, 1955      DJM:[id number]   Palliative Medicine Team at [facility] Progress Note    Subjective: PVS, mentation unchanged. [name] at bedside, does not want information for medical update.     Filed Vitals:   24-Jan-2014 0900  BP: 70/37  Pulse: 50  Temp:   Resp: 18   Physical exam: GEN: vented, diaphoretic CV: mild bradycardia LUNGS: scattered coarse breath sounds ABD; distended EXT: anasarca  CBC    Component Value Date/Time   WBC 11.3* 01/07/2014 0400   RBC 2.33* 01/07/2014 0400   RBC 2.10* 12/19/2013 1710   HGB 6.7* 01/07/2014 0400   HCT 20.6* 01/07/2014 0400   PLT 188 01/07/2014 0400   MCV 88.4 01/07/2014 0400   MCH 28.8 01/07/2014 0400   MCHC 32.5 01/07/2014 0400   RDW 21.6* 01/07/2014 0400   LYMPHSABS 0.7 01/07/2014 0400   MONOABS 0.5 01/07/2014 0400   EOSABS 0.0 01/07/2014 0400   BASOSABS 0.0 01/07/2014 0400    CMP     Component Value Date/Time   NA 149* 01/07/2014 0400   K 4.3 01/07/2014 0400   CL 91* 01/07/2014 0400   CO2 20 01/07/2014 0400   GLUCOSE 175* 01/07/2014 0400   BUN 187* 01/07/2014 0400   CREATININE 6.43* 01/07/2014 0400   CALCIUM 8.9 01/07/2014 0400   PROT 7.9 12/13/2013 1713   ALBUMIN 2.0* 12/18/2013 1713   AST 68* 12/04/2013 1713   ALT 42* 12/13/2013 1713   ALKPHOS 339* 12/13/2013 1713   BILITOT 1.1 12/04/2013 1713   GFRNONAA 6* 01/07/2014 0400   GFRAA 7* 01/07/2014 0400     Assessment and plan: 58 yo female with DM, HTN, 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 Re-admitted for anemia and AKI.   1. Code Status: Now Full Code again   2. GOC- See previous documentation, including family meetings from 01/06/14. [name] at bedside, but does not need update on what we are seeing this morning. Remo Lipps is on active dying trajectory with acidosis, hypoxia, ARDS, azotemia. Brothers/Sisters not coming until Wed now. i encouraged [name] that they should come sooner if they would like to see Stehanie.  If CCM team feels uncomfortable with CPR/code  (Unlikely to be succesful), I would support ethics consultation for this being potentially inappropriate treatment and following dispute resolution process regarding medical futility.   3. Symptom Management:  1. Secretions- restarted scopolamine patch.  2. Dyspnea/Vent dysynchrony- Fentanyl PRN  4. Psychosocial: 2 daughters Vella Redhead [phone]- [address] [place] and [place] [phone]- [place])   5. Spiritual: Clearly of utmost importance to family. Vella Redhead has met with our chaplain service and reports positive experience. She does not wish for them to come back at this time.   Doran Clay D.O. Palliative Medicine Team at [facility]  Pager: [phone] Team Phone: [phone]

## 2014-01-30 NOTE — Progress Notes (Signed)
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 She was transfered from [facility] with anemia 4.6 requiring transfusion & AKI.  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  7/21 > 7/23 - Admission for anemia requiring transfusion 7/30  admitted for anemia Hgb 4.6, transfuse PRBC, consulted GI, HyperK to 6.5, worsening AKI 7/31  Hgb improved to 7.3 (s/p 3u PRBC), K down to 5.3 (s/p kayexalate), Lactic acid 1.4, PCT stable 4.15          Remains on vent support, resolved bleeding, CXR (inc b/l interstitial opacities edema vs PNA) 8/1 EGD > no clear evidence of bleeding 8/5 Daughter was unable to make it to the ethics meeting yesterday and called last night again saying she was having car trouble and would probably be unable to make it in today.  Patient continues to have periods of severe hypoxemia with turning, requiring high level vent support.  8/6 Daughter Vella Redhead, was met by palliative care  -> DNAR but full medical care 8/7 Change in code status noted to DNAR . Clinically severe ARDS persists 8/8 Back to full code. Worsening ARDS   SUBJECTIVE/OVERNIGHT/INTERVAL HX 8/10 severe ards persists on full vent support with increasing kidney failure and deteriorating mental status. Pt vomited over night and was turned off tube feeds. Currently is hypoglycemic needing dextrose. Residuals 80.  VITAL  SIGNS: Temp:  [94.4 F (34.7 C)-98 F (36.7 C)] 97.2 F (36.2 C) (08/10 0805) Pulse Rate:  [42-123] 50 (08/10 0900) Resp:  [0-37] 18 (08/10 0900) BP: (68-120)/(17-81) 70/37 mmHg (08/10 0900) SpO2:  [66 %-92 %] 79 % (08/10 0900) FiO2 (%):  [100 %] 100 % (08/10 0900) HEMODYNAMICS:   VENTILATOR SETTINGS: Vent Mode:  [-] PRVC FiO2 (%):  [100 %] 100 % Set Rate:  [16 bmp-30 bmp] 30 bmp Vt Set:  [350 mL-450 mL] 350 mL PEEP:  [15 cmH20] 15 cmH20 Plateau Pressure:  [25 STM19-62 cmH20] 25 cmH20 INTAKE / OUTPUT:  Intake/Output Summary (Last 24 hours) at Jan 11, 2014 0943 Last data filed at 01-11-14 0900  Gross per 24 hour  Intake 1977.5 ml  Output    250 ml  Net 1727.5 ml    PHYSICAL EXAMINATION:  Gen: does not withdrawal to pain,  HEENT: NCAT,  trach PULM: dysnch with vent CV: bradycardia with regular rhythm, S1/S2, cannot assess JVD AB: BS infrequent, soft, nontender, PEG Ext: warm, edema +2, no clubbing, no cyanosis Derm: no rash or skin breakdown Neuro: minimal response to external stimuli, GCS 3   LABS:  CBC  Recent Labs Lab 01/03/14 0500 01/06/14 0500 01/07/14 0400  WBC 9.2 16.7* 11.3*  HGB 7.9* 7.8* 6.7*  HCT 24.3* 24.3* 20.6*  PLT 228 225 188   Coag's No results found for this basename: APTT, INR,  in the last 168 hours BMET  Recent Labs Lab 01/04/14 0500 01/06/14 0500 01/07/14 0400  NA 146 146 149*  K 3.5* 3.8 4.3  CL 93* 91* 91*  CO2 23 21 20   BUN 169* 178* 187*  CREATININE 5.57* 6.18* 6.43*  GLUCOSE 122* 127* 175*   Electrolytes  Recent Labs Lab 01/04/14 0500 01/06/14 0500 01/07/14 0400  CALCIUM 8.4 9.0 8.9   Sepsis Markers No results found for this basename: LATICACIDVEN, PROCALCITON, O2SATVEN,  in the last 168 hours ABG  Recent Labs Lab 01-16-2014 0100  PHART 7.062*  PCO2ART 59.6*  PO2ART 42.0*   Liver Enzymes No results found for this basename: AST, ALT, ALKPHOS, BILITOT, ALBUMIN,  in the last 168 hours Cardiac Enzymes No  results found for this basename: TROPONINI, PROBNP,  in the last 168 hours Glucose  Recent Labs Lab 16-Jan-2014 0142 01/16/2014 0347 01-16-2014 0427 01-16-2014 0758 Jan 16, 2014 0803 16-Jan-2014 0819  GLUCAP 82 46* 93 20* 24* 83    Imaging Dg Chest Port 1 View  01/07/2014   CLINICAL DATA:  Hypoxemia.  Shortness of breath.  EXAM: PORTABLE CHEST - 1 VIEW  COMPARISON:  Chest x-ray 01/06/2014.  FINDINGS: Tracheostomy tube tip 4.3 cm above the carina. There is a Right upper extremity PICC with tip terminating in the distal superior vena cava. Lung volumes appear normal. There continues to be widespread airspace disease throughout the lungs bilaterally, however, aeration has significantly improved compared with yesterday's examination. Lung possible small left pleural effusion. Heart size appears borderline enlarged. The patient is rotated to the left on today's exam, resulting in distortion of the mediastinal contours and reduced diagnostic sensitivity and specificity for mediastinal pathology.  IMPRESSION: 1. Support apparatus, as above. 2. Overall, aeration has significantly improved compared with yesterday's examination, suggesting resolving edema/inflammation (i.e., ARDS) and/or multilobar pneumonia.   Electronically Signed   By: Vinnie Langton M.D.   On: 01/07/2014 23:25   - CXR total white out   ASSESSMENT / PLAN:  PULMONARY A: Severe chronic hypoxemic respiratory failure, vent dependent > status unchanged for months, very unlikely to recover HCAP  - Severe ARDS physiology continues and worse 01/07/14. She will die from this  P:   Full vent support, 8cc/kg, RR 16., High PEEP15. O2 100% Continue current vent settings, unable to wean;  CARDIOVASCULAR A: HTN, pre-admit   - Not on pressors  P:  Monitor BP, MAP goal >65 Hold home BP meds - Hydralazine, Minoxidil  RENAL A:  Acute renal failureI> worsening      - made > 200cc urine x 24h  P:   continue 1300 po bicarb tid Agree with  Renal  - dialysis would be of no medical benefit  GASTROINTESTINAL A: Acute GI Bleed, with melena - FOBT (positive), EGD neg > currently no sign of bleeding but Hgb continues to trend down PEG dependent GERD   - no active gi bleed  P:   Cont Protonix IV q 12h, TF @ 10/h  HEMATOLOGIC A: Acute Blood Loss Anemia, s/p 4u PRBC  Acute on chronic anemia   - hgb < 7gm%  P:  Monitor CBC daily Transfuse PRBC for Hb <7; repeat PRBC 01/07/14 VTE prophylaxis - SCDs DC phlebotomies to conserve on blood  INFECTIOUS A: HCAP> pseudomonas P:   Continue zosyn for total 14 days- (day 8 of 14)  ENDOCRINE A: DM, Type 2 Hypoglycemia P:   Resistant SSI CBG monitoring q 4 hr, ct D10   NEUROLOGIC A: Persistent vegetative state, s/p ICH Hx Seizures P:   Continue Keppra    GLOBAL: Patient is deteriorating rapidly, she is not a dialysis candidate, all therapies being provided  have been futile, HR currently in the 20's and BP in the 50's, pressors will not be effective in this condition and are medically futile and will not be provided.  I spoke with daughter at length, attempting to explain the medical condition and that patient will likely expire today.  She continues to insist on full code status.  All medical care provided is completely ineffective.  The patient is critically ill with multiple organ systems failure and requires high complexity decision making for assessment and support, frequent evaluation and titration of therapies, application of advanced monitoring technologies and extensive interpretation of multiple databases.   Critical Care Time devoted to patient care services described in this note is  35  Minutes.  Rush Farmer, M.D. [facility] Pulmonary/Critical Care Medicine. Pager: [phone]. After hours pager: [phone].  January 11, 2014 9:43 AM

## 2014-01-30 NOTE — Progress Notes (Signed)
CBG taken with result of 46.  50 mL D50 was administered per protocol.  Repeat CBG >90.  [facility] RN notified. Will continue to monitor patient carefully.   Devona Konig, RN.

## 2014-01-30 NOTE — Progress Notes (Signed)
Pola CornELINK MD notified of BP 73/30. No further orders at this time.  Will continue to monitor carefully.  Devona KonigAvery Delila Kuklinski, RN

## 2014-01-30 NOTE — Progress Notes (Signed)
[facility] Dr. Curt BearsBelanger, due to patient's low O2 sat. Ordered paralytic, fentanyl, propofol, TOF and BIS monitoring in addition to making vent changes and checking an ABG. Set up TOF and BIS monitor, BIS monitor read 55-59 with no sedation on board.  Vent changes made by RT and pt began vomiting.  Mouth suctioned, patient cleaned and tube feedings turned off.  Due to low BIS reading drips held until able to talk to MD again.  [facility] called and MDs in report, Dr. Darrick Pennaeterding called back and camera-ed in when told of BIS readings.  MD said she would send over Dr. Delma Posthristianto to assess the patient.  Dr. Delma Posthristianto performed a bedside ECHO and made vent changes, in addition to assessing patient's response to stimulation.  It was decided that the neuromuscular blockade would not be beneficial for this patient so the nimbex, propofol and fentanyl were held.  An ABG was ordered and RT was to draw the ABG in 30 minutes due to the vent changes that had been made.  A CBG was obtained with the result of 42, the measurement was taken a second time by the CNA to verify the low reading with a result of 41.  The RN drew blood from the PICC to verify the CBG, again the result was 41.  The ICU Hypoglycemia protocol was initiated and 50 mL D50 was administered.  The CBG was repeated with a result of 82.  The ABG resulted with a pH of 7.062.  Two amps of sodium bicarb were ordered and given.  Pola CornELINK MD was again called to discuss plan of care.  It was confirmed a second time not to start the paralytics and sedation and the MD instructed not to restart the tube feedings at this time.  Will continue to monitor patient carefully.   Devona KonigAvery Beverly Ferner, RN

## 2014-01-30 NOTE — Progress Notes (Signed)
Patient had spontaneous return of circulation after calling code.  Sats remain in the teens and HR in the 20's.  Spoke with daughter, agreed with DNR status at this point but refused removal of vent.  Will await cardiac death then will d/c vent.  Additional CC time of 45 min.  Alyson ReedyWesam G. Palestine Mosco, M.D. [facility]. Pager: [phone]. After hours pager: [phone].

## 2014-01-30 NOTE — Progress Notes (Signed)
Pt in asystole.  Ventilator still on.  MD Molli KnockYacoub made aware. Ventilator turned off.  Absent heart sounds and absent breath sounds verified by Deberah PeltonKari Elysia Grand, RN and Acey LavPhyllis McKinney, RN.  Daughter at bedside.

## 2014-01-30 DEATH — deceased

## 2014-10-13 IMAGING — CR DG CHEST 1V PORT
1 series · 1 of 1 positions shown · non-contrast
Comparison: None.

CLINICAL DATA: Congestion, evaluate for pneumonia

EXAM:
PORTABLE CHEST - 1 VIEW

[portable]
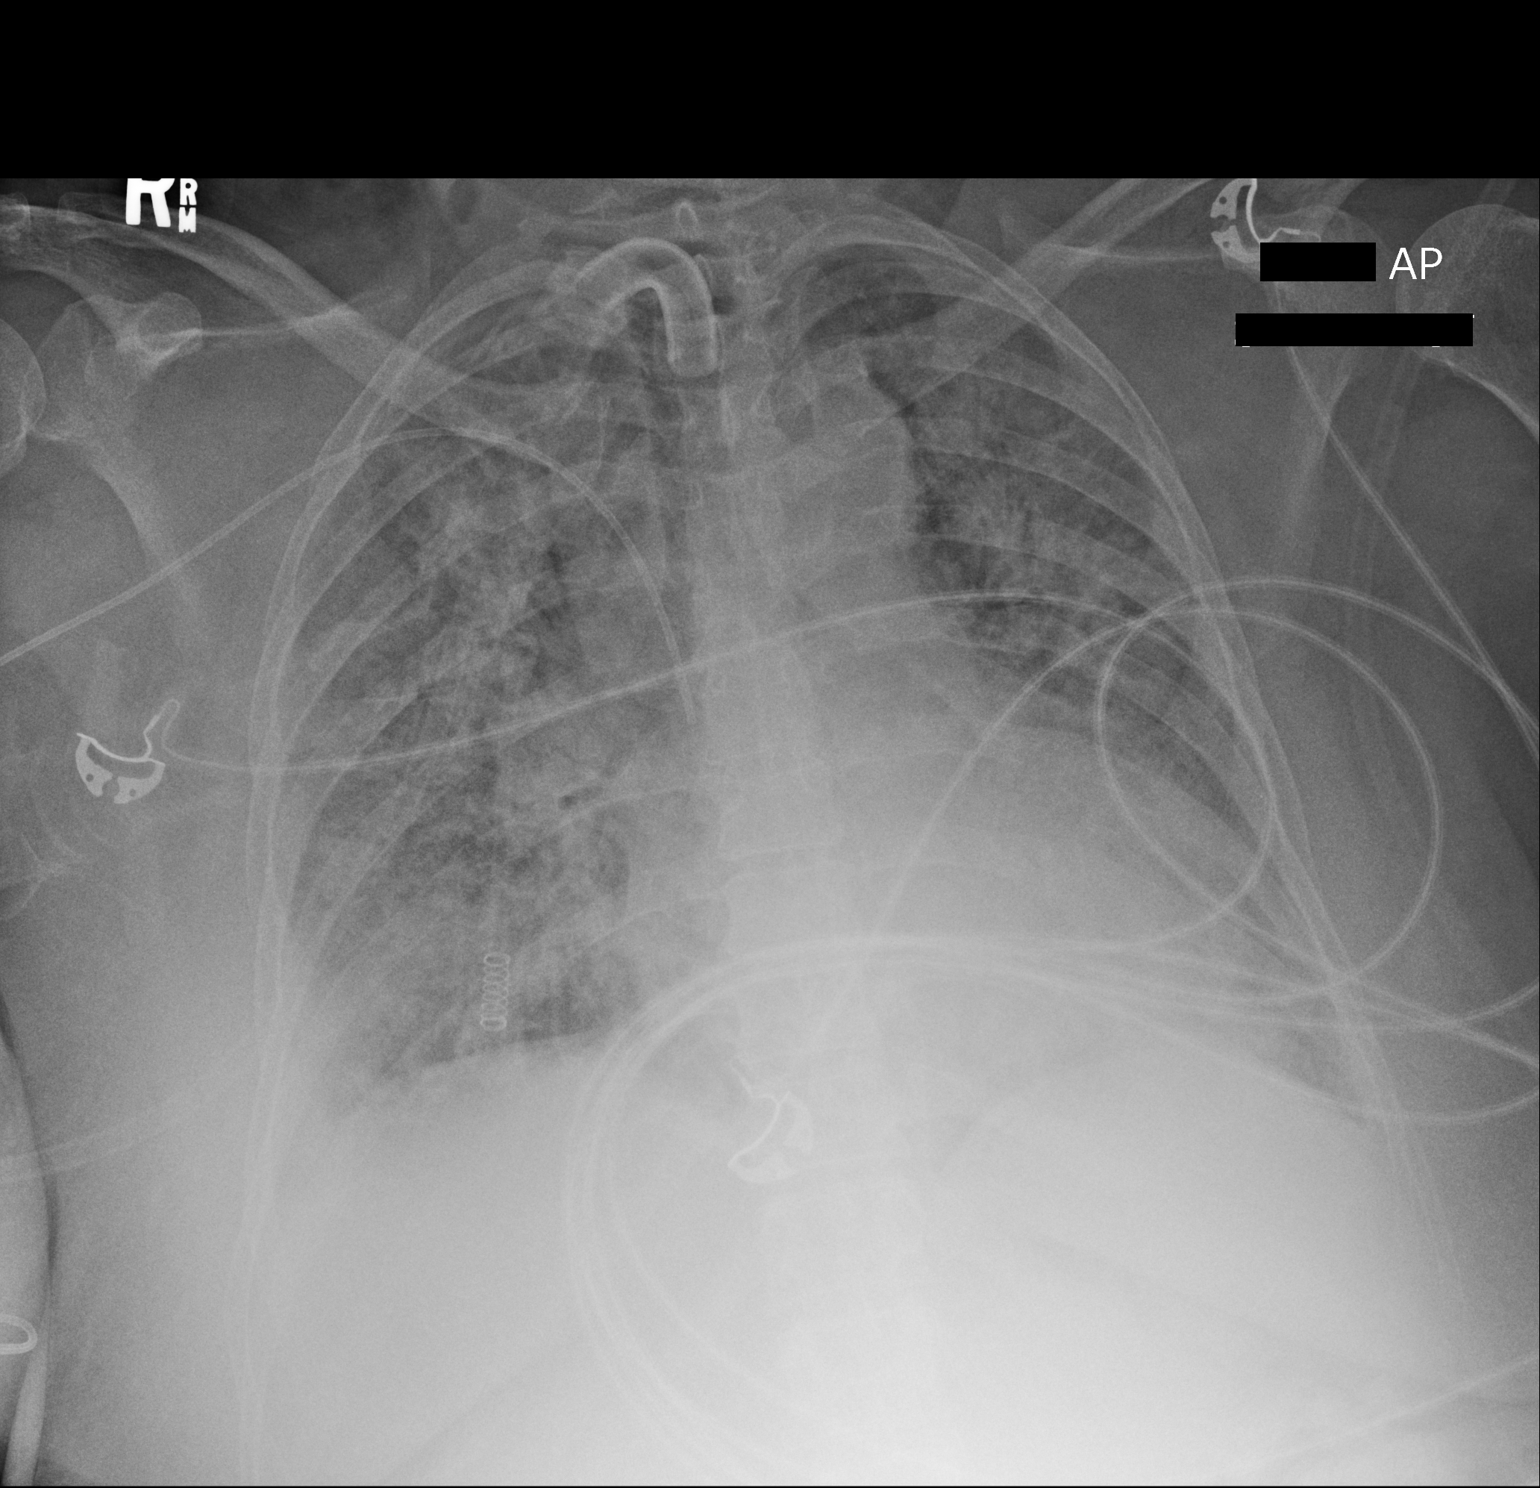

[1 of 1 positions shown; findings below may reference images not displayed]

FINDINGS: Tracheostomy tube projects over the tracheal air column. Right-sided
decline projects with tip over the cavoatrial junction.

Mild cardiac enlargement. Severe bilateral diffuse airspace disease
with mixed interstitial and alveolar opacities. No significant
pleural effusion.
IMPRESSION: Extensive bilateral airspace disease. Acute pulmonary edema is a
consideration. Other possibilities include extensive bilateral
pneumonia, alveolar hemorrhage, ARDS.

## 2014-10-23 IMAGING — CR DG CHEST 1V PORT
1 series · 1 of 1 positions shown · non-contrast
Comparison: 12/28/2013

CLINICAL DATA: Possible pneumonia

EXAM:
PORTABLE CHEST - 1 VIEW

[portable]
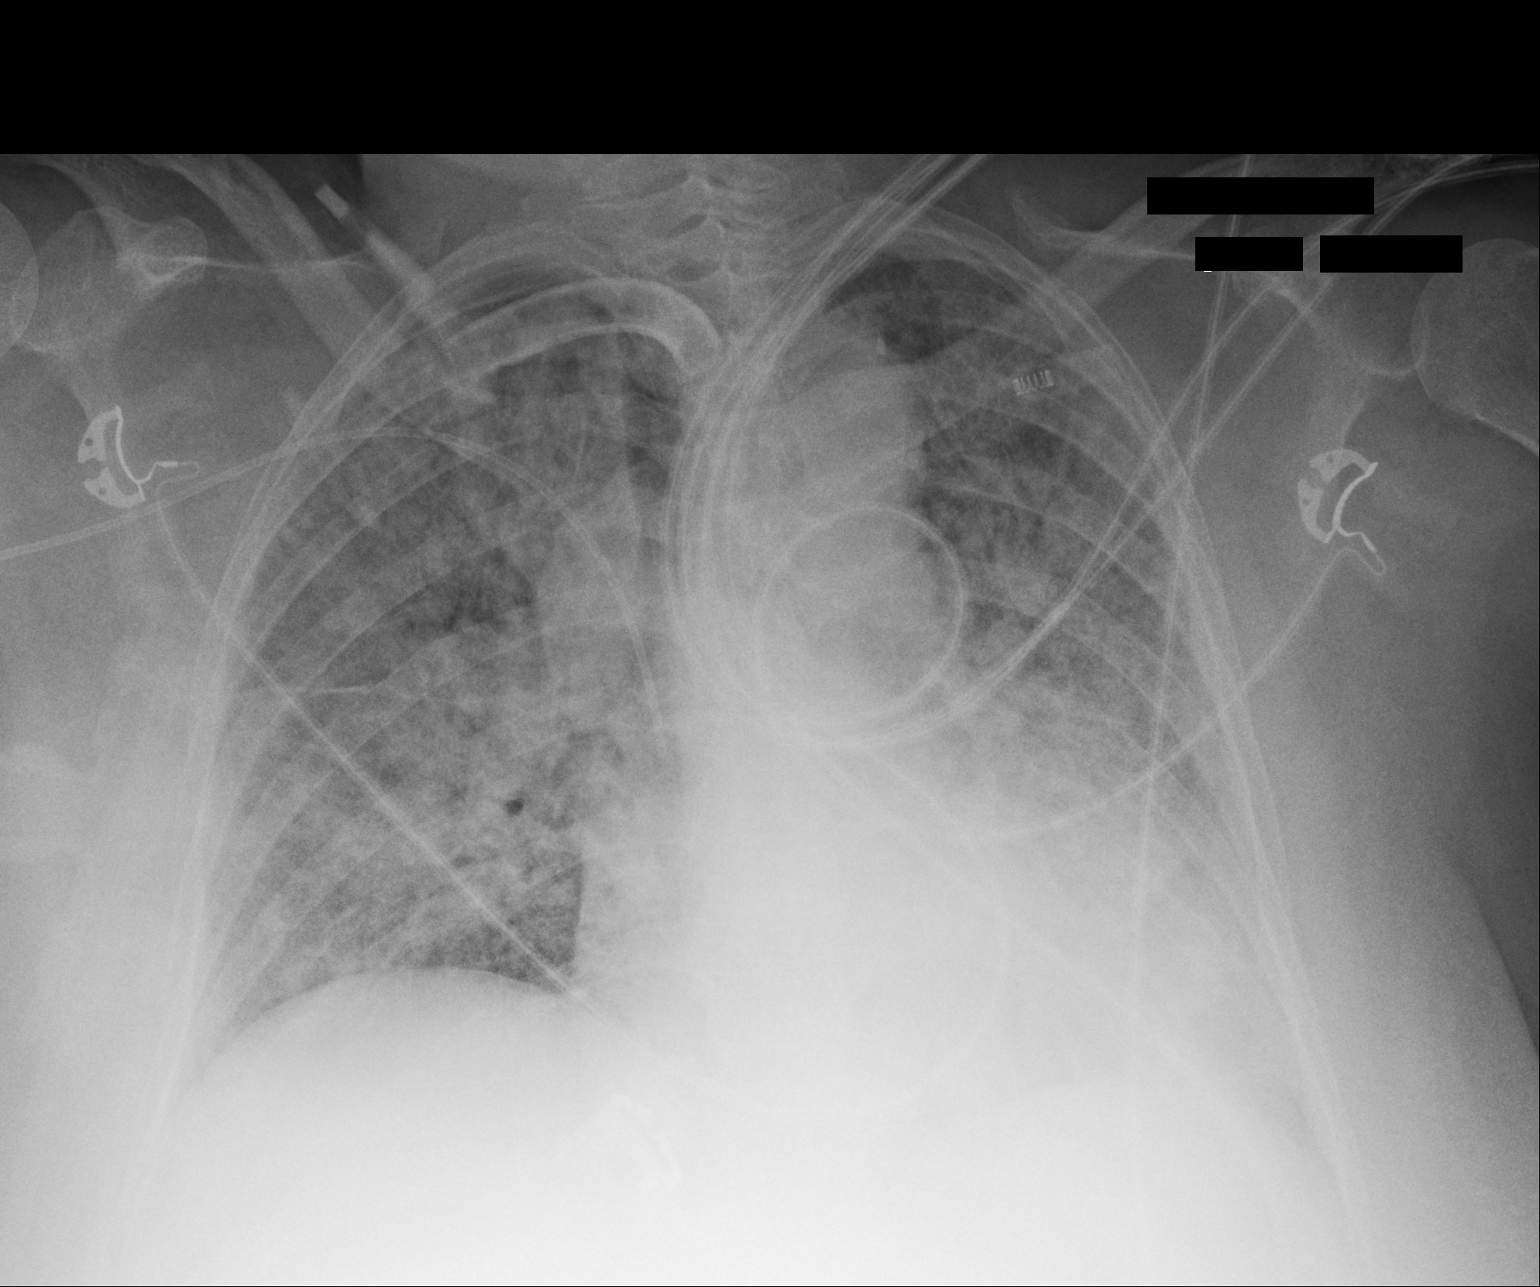

[1 of 1 positions shown; findings below may reference images not displayed]

FINDINGS: Tracheostomy tube is unchanged in position. Persistent diffuse
bilateral alveolar and airspace opacities. Findings may be due to
bilateral pulmonary edema or bilateral pneumonia. No pneumothorax.
IMPRESSION: No significant change. Diffuse bilateral airspace disease and
interstitial prominence again noted. Findings may be due to
bilateral pulmonary edema or bilateral pneumonia.
